# Patient Record
Sex: Female | Born: 1935 | Race: White | Hispanic: No | Smoking: Never smoker
Health system: Southern US, Community
[De-identification: ages and names within clinical notes are randomized; demographics above are authoritative.]

## PROBLEM LIST (undated history)

## (undated) DIAGNOSIS — R279 Unspecified lack of coordination: Secondary | ICD-10-CM

## (undated) DIAGNOSIS — J449 Chronic obstructive pulmonary disease, unspecified: Secondary | ICD-10-CM

## (undated) DIAGNOSIS — G3183 Dementia with Lewy bodies: Secondary | ICD-10-CM

## (undated) DIAGNOSIS — R4701 Aphasia: Secondary | ICD-10-CM

## (undated) DIAGNOSIS — F0280 Dementia in other diseases classified elsewhere without behavioral disturbance: Secondary | ICD-10-CM

## (undated) HISTORY — PX: CHOLECYSTECTOMY: SHX55

---

## 2004-06-03 ENCOUNTER — Other Ambulatory Visit: Payer: Self-pay

## 2005-11-16 ENCOUNTER — Other Ambulatory Visit: Payer: Self-pay

## 2005-11-16 ENCOUNTER — Emergency Department: Payer: Self-pay | Admitting: Internal Medicine

## 2006-06-24 ENCOUNTER — Emergency Department: Payer: Self-pay | Admitting: Emergency Medicine

## 2012-04-22 ENCOUNTER — Emergency Department: Payer: Self-pay | Admitting: Emergency Medicine

## 2012-04-22 LAB — LIPASE, BLOOD: Lipase: 104 U/L (ref 73–393)

## 2012-04-22 LAB — COMPREHENSIVE METABOLIC PANEL
Albumin: 3.8 g/dL (ref 3.4–5.0)
Alkaline Phosphatase: 88 U/L (ref 50–136)
Anion Gap: 7 (ref 7–16)
BUN: 13 mg/dL (ref 7–18)
Co2: 27 mmol/L (ref 21–32)
Creatinine: 1.37 mg/dL — ABNORMAL HIGH (ref 0.60–1.30)
EGFR (African American): 42 — ABNORMAL LOW
EGFR (Non-African Amer.): 36 — ABNORMAL LOW
Glucose: 112 mg/dL — ABNORMAL HIGH (ref 65–99)
Osmolality: 288 (ref 275–301)
SGOT(AST): 29 U/L (ref 15–37)
SGPT (ALT): 21 U/L
Sodium: 144 mmol/L (ref 136–145)
Total Protein: 6.7 g/dL (ref 6.4–8.2)

## 2012-04-22 LAB — CBC
HCT: 39.5 % (ref 35.0–47.0)
MCHC: 34.3 g/dL (ref 32.0–36.0)
Platelet: 218 10*3/uL (ref 150–440)
RBC: 4.57 10*6/uL (ref 3.80–5.20)
RDW: 13.4 % (ref 11.5–14.5)
WBC: 7.7 10*3/uL (ref 3.6–11.0)

## 2012-10-09 ENCOUNTER — Emergency Department: Payer: Self-pay

## 2012-10-09 LAB — URINALYSIS, COMPLETE
Glucose,UR: NEGATIVE mg/dL (ref 0–75)
Ketone: NEGATIVE
Ph: 5 (ref 4.5–8.0)
Protein: NEGATIVE
Specific Gravity: 1.015 (ref 1.003–1.030)
WBC UR: 6 /HPF (ref 0–5)

## 2012-10-09 LAB — CBC
HGB: 12.6 g/dL (ref 12.0–16.0)
MCH: 29.1 pg (ref 26.0–34.0)
MCHC: 34 g/dL (ref 32.0–36.0)
MCV: 86 fL (ref 80–100)
Platelet: 203 10*3/uL (ref 150–440)
RBC: 4.35 10*6/uL (ref 3.80–5.20)
RDW: 13.4 % (ref 11.5–14.5)
WBC: 6.9 10*3/uL (ref 3.6–11.0)

## 2012-10-09 LAB — COMPREHENSIVE METABOLIC PANEL
Albumin: 3.6 g/dL (ref 3.4–5.0)
Alkaline Phosphatase: 96 U/L (ref 50–136)
Anion Gap: 3 — ABNORMAL LOW (ref 7–16)
Bilirubin,Total: 0.5 mg/dL (ref 0.2–1.0)
Calcium, Total: 9.1 mg/dL (ref 8.5–10.1)
Co2: 28 mmol/L (ref 21–32)
Creatinine: 1.46 mg/dL — ABNORMAL HIGH (ref 0.60–1.30)
EGFR (African American): 38 — ABNORMAL LOW
EGFR (Non-African Amer.): 33 — ABNORMAL LOW
Glucose: 92 mg/dL (ref 65–99)
Potassium: 4.6 mmol/L (ref 3.5–5.1)
SGOT(AST): 36 U/L (ref 15–37)
Sodium: 144 mmol/L (ref 136–145)
Total Protein: 6.7 g/dL (ref 6.4–8.2)

## 2012-10-09 LAB — LIPASE, BLOOD: Lipase: 104 U/L (ref 73–393)

## 2012-10-13 ENCOUNTER — Emergency Department: Payer: Self-pay | Admitting: Emergency Medicine

## 2012-10-13 LAB — URINALYSIS, COMPLETE
Bacteria: NONE SEEN
Bilirubin,UR: NEGATIVE
Blood: NEGATIVE
Glucose,UR: NEGATIVE mg/dL (ref 0–75)
Ketone: NEGATIVE
RBC,UR: 1 /HPF (ref 0–5)
Specific Gravity: 1.005 (ref 1.003–1.030)
WBC UR: 1 /HPF (ref 0–5)

## 2012-10-13 LAB — COMPREHENSIVE METABOLIC PANEL
Albumin: 3.6 g/dL (ref 3.4–5.0)
Alkaline Phosphatase: 96 U/L (ref 50–136)
Anion Gap: 8 (ref 7–16)
BUN: 16 mg/dL (ref 7–18)
Bilirubin,Total: 0.7 mg/dL (ref 0.2–1.0)
Chloride: 109 mmol/L — ABNORMAL HIGH (ref 98–107)
Creatinine: 1.63 mg/dL — ABNORMAL HIGH (ref 0.60–1.30)
EGFR (African American): 34 — ABNORMAL LOW
Glucose: 109 mg/dL — ABNORMAL HIGH (ref 65–99)
Osmolality: 283 (ref 275–301)
Potassium: 4.4 mmol/L (ref 3.5–5.1)
SGOT(AST): 33 U/L (ref 15–37)
Total Protein: 6.9 g/dL (ref 6.4–8.2)

## 2012-10-13 LAB — CBC
HCT: 36.1 % (ref 35.0–47.0)
MCH: 29.7 pg (ref 26.0–34.0)
MCV: 86 fL (ref 80–100)
Platelet: 185 10*3/uL (ref 150–440)
RDW: 13.3 % (ref 11.5–14.5)
WBC: 7.3 10*3/uL (ref 3.6–11.0)

## 2012-10-13 LAB — LIPASE, BLOOD: Lipase: 173 U/L (ref 73–393)

## 2013-01-14 ENCOUNTER — Emergency Department: Payer: Self-pay | Admitting: Emergency Medicine

## 2013-01-14 LAB — URINALYSIS, COMPLETE
Bilirubin,UR: NEGATIVE
Glucose,UR: NEGATIVE mg/dL (ref 0–75)
Ketone: NEGATIVE
Protein: NEGATIVE
Specific Gravity: 1.005 (ref 1.003–1.030)
WBC UR: 10 /HPF (ref 0–5)

## 2013-01-14 LAB — COMPREHENSIVE METABOLIC PANEL
Albumin: 3.7 g/dL (ref 3.4–5.0)
Alkaline Phosphatase: 84 U/L (ref 50–136)
BUN: 14 mg/dL (ref 7–18)
Bilirubin,Total: 0.8 mg/dL (ref 0.2–1.0)
Creatinine: 1.16 mg/dL (ref 0.60–1.30)
EGFR (African American): 51 — ABNORMAL LOW
EGFR (Non-African Amer.): 44 — ABNORMAL LOW
Glucose: 117 mg/dL — ABNORMAL HIGH (ref 65–99)
Osmolality: 277 (ref 275–301)
Potassium: 4.4 mmol/L (ref 3.5–5.1)

## 2013-01-14 LAB — CBC
HCT: 40.7 % (ref 35.0–47.0)
HGB: 13.7 g/dL (ref 12.0–16.0)
MCH: 28.5 pg (ref 26.0–34.0)
MCHC: 33.7 g/dL (ref 32.0–36.0)
MCV: 84 fL (ref 80–100)
Platelet: 261 10*3/uL (ref 150–440)
RBC: 4.82 10*6/uL (ref 3.80–5.20)
RDW: 14.1 % (ref 11.5–14.5)
WBC: 9.3 10*3/uL (ref 3.6–11.0)

## 2013-01-27 ENCOUNTER — Emergency Department: Payer: Self-pay | Admitting: Emergency Medicine

## 2013-01-27 LAB — URINALYSIS, COMPLETE
Bacteria: NONE SEEN
Glucose,UR: NEGATIVE mg/dL (ref 0–75)
Nitrite: NEGATIVE
RBC,UR: 1 /HPF (ref 0–5)
Squamous Epithelial: 4

## 2013-01-27 LAB — COMPREHENSIVE METABOLIC PANEL
Albumin: 3.7 g/dL (ref 3.4–5.0)
Alkaline Phosphatase: 75 U/L (ref 50–136)
Anion Gap: 7 (ref 7–16)
BUN: 14 mg/dL (ref 7–18)
Calcium, Total: 9.3 mg/dL (ref 8.5–10.1)
Chloride: 107 mmol/L (ref 98–107)
Creatinine: 1.41 mg/dL — ABNORMAL HIGH (ref 0.60–1.30)
EGFR (Non-African Amer.): 35 — ABNORMAL LOW
Glucose: 109 mg/dL — ABNORMAL HIGH (ref 65–99)

## 2013-01-27 LAB — CBC
HCT: 40.9 % (ref 35.0–47.0)
HGB: 13.6 g/dL (ref 12.0–16.0)
MCH: 28.1 pg (ref 26.0–34.0)
WBC: 8.9 10*3/uL (ref 3.6–11.0)

## 2013-01-27 LAB — LIPASE, BLOOD: Lipase: 150 U/L (ref 73–393)

## 2013-05-26 ENCOUNTER — Ambulatory Visit: Payer: Self-pay | Admitting: Orthopaedic Surgery

## 2013-05-26 ENCOUNTER — Inpatient Hospital Stay: Payer: Self-pay | Admitting: Internal Medicine

## 2013-05-26 LAB — COMPREHENSIVE METABOLIC PANEL
Albumin: 3.2 g/dL — ABNORMAL LOW (ref 3.4–5.0)
Alkaline Phosphatase: 84 U/L (ref 50–136)
Anion Gap: 6 — ABNORMAL LOW (ref 7–16)
BUN: 16 mg/dL (ref 7–18)
Bilirubin,Total: 0.8 mg/dL (ref 0.2–1.0)
Calcium, Total: 8.7 mg/dL (ref 8.5–10.1)
Osmolality: 280 (ref 275–301)
SGOT(AST): 23 U/L (ref 15–37)
SGPT (ALT): 22 U/L (ref 12–78)

## 2013-05-26 LAB — CBC
HGB: 13 g/dL (ref 12.0–16.0)
MCH: 29 pg (ref 26.0–34.0)
MCV: 88 fL (ref 80–100)
RBC: 4.48 10*6/uL (ref 3.80–5.20)
WBC: 6.9 10*3/uL (ref 3.6–11.0)

## 2013-05-26 LAB — URINALYSIS, COMPLETE
Bacteria: NONE SEEN
Glucose,UR: NEGATIVE mg/dL (ref 0–75)
Ketone: NEGATIVE
Leukocyte Esterase: NEGATIVE
Nitrite: NEGATIVE
Protein: NEGATIVE
RBC,UR: 2 /HPF (ref 0–5)
Specific Gravity: 1.017 (ref 1.003–1.030)
Squamous Epithelial: NONE SEEN
WBC UR: 2 /HPF (ref 0–5)

## 2013-05-27 LAB — BASIC METABOLIC PANEL
Anion Gap: 2 — ABNORMAL LOW (ref 7–16)
BUN: 21 mg/dL — ABNORMAL HIGH (ref 7–18)
Calcium, Total: 8.3 mg/dL — ABNORMAL LOW (ref 8.5–10.1)
Chloride: 104 mmol/L (ref 98–107)
Creatinine: 1.28 mg/dL (ref 0.60–1.30)
EGFR (African American): 45 — ABNORMAL LOW
EGFR (Non-African Amer.): 39 — ABNORMAL LOW
Glucose: 104 mg/dL — ABNORMAL HIGH (ref 65–99)
Sodium: 137 mmol/L (ref 136–145)

## 2013-05-27 LAB — CBC WITH DIFFERENTIAL/PLATELET
Basophil #: 0.1 10*3/uL (ref 0.0–0.1)
Basophil %: 0.5 %
Eosinophil #: 0 10*3/uL (ref 0.0–0.7)
HGB: 11.4 g/dL — ABNORMAL LOW (ref 12.0–16.0)
Lymphocyte #: 1.2 10*3/uL (ref 1.0–3.6)
Lymphocyte %: 9.8 %
MCH: 29.2 pg (ref 26.0–34.0)
MCHC: 33.6 g/dL (ref 32.0–36.0)
Monocyte #: 0.9 x10 3/mm (ref 0.2–0.9)
Neutrophil #: 10 10*3/uL — ABNORMAL HIGH (ref 1.4–6.5)
Platelet: 199 10*3/uL (ref 150–440)
RBC: 3.91 10*6/uL (ref 3.80–5.20)
RDW: 13.1 % (ref 11.5–14.5)
WBC: 12.2 10*3/uL — ABNORMAL HIGH (ref 3.6–11.0)

## 2013-05-27 LAB — TSH: Thyroid Stimulating Horm: 0.598 u[IU]/mL

## 2013-05-27 LAB — PROTIME-INR: INR: 1.1

## 2013-05-28 LAB — CBC WITH DIFFERENTIAL/PLATELET
Basophil #: 0.1 10*3/uL (ref 0.0–0.1)
Basophil %: 0.9 %
HGB: 9 g/dL — ABNORMAL LOW (ref 12.0–16.0)
Lymphocyte #: 1.2 10*3/uL (ref 1.0–3.6)
Lymphocyte %: 13.9 %
MCHC: 34.4 g/dL (ref 32.0–36.0)
MCV: 86 fL (ref 80–100)
Monocyte #: 0.9 x10 3/mm (ref 0.2–0.9)
Monocyte %: 10.7 %
Neutrophil #: 6.2 10*3/uL (ref 1.4–6.5)
Neutrophil %: 74.2 %
Platelet: 132 10*3/uL — ABNORMAL LOW (ref 150–440)
RBC: 3.03 10*6/uL — ABNORMAL LOW (ref 3.80–5.20)
RDW: 13 % (ref 11.5–14.5)
WBC: 8.4 10*3/uL (ref 3.6–11.0)

## 2013-05-28 LAB — BASIC METABOLIC PANEL
Calcium, Total: 8.4 mg/dL — ABNORMAL LOW (ref 8.5–10.1)
Co2: 27 mmol/L (ref 21–32)
EGFR (African American): 56 — ABNORMAL LOW

## 2013-05-29 LAB — BASIC METABOLIC PANEL
Anion Gap: 4 — ABNORMAL LOW (ref 7–16)
BUN: 20 mg/dL — ABNORMAL HIGH (ref 7–18)
Calcium, Total: 8.1 mg/dL — ABNORMAL LOW (ref 8.5–10.1)
Chloride: 106 mmol/L (ref 98–107)
Co2: 26 mmol/L (ref 21–32)
Creatinine: 1.29 mg/dL (ref 0.60–1.30)
Osmolality: 275 (ref 275–301)
Potassium: 4 mmol/L (ref 3.5–5.1)
Sodium: 136 mmol/L (ref 136–145)

## 2013-05-29 LAB — HEMOGLOBIN: HGB: 8.2 g/dL — ABNORMAL LOW (ref 12.0–16.0)

## 2014-07-19 IMAGING — XA DG C-ARM 1-60 MIN
1 series · 4 of 4 positions shown · non-contrast
Comparison: none

REASON FOR EXAM: left hip fracture
COMMENTS:

PROCEDURE:     DXR - DXR C-ARM WITH SPOT IMAGES  - May 27, 2013  [DATE]
RESULT:     Patient status post reduction internal fixation left hip
fracture. Good anatomic alignment noted.

[Series 6001: (person_name) · 4 of 4 slices shown]
[im 1/4]
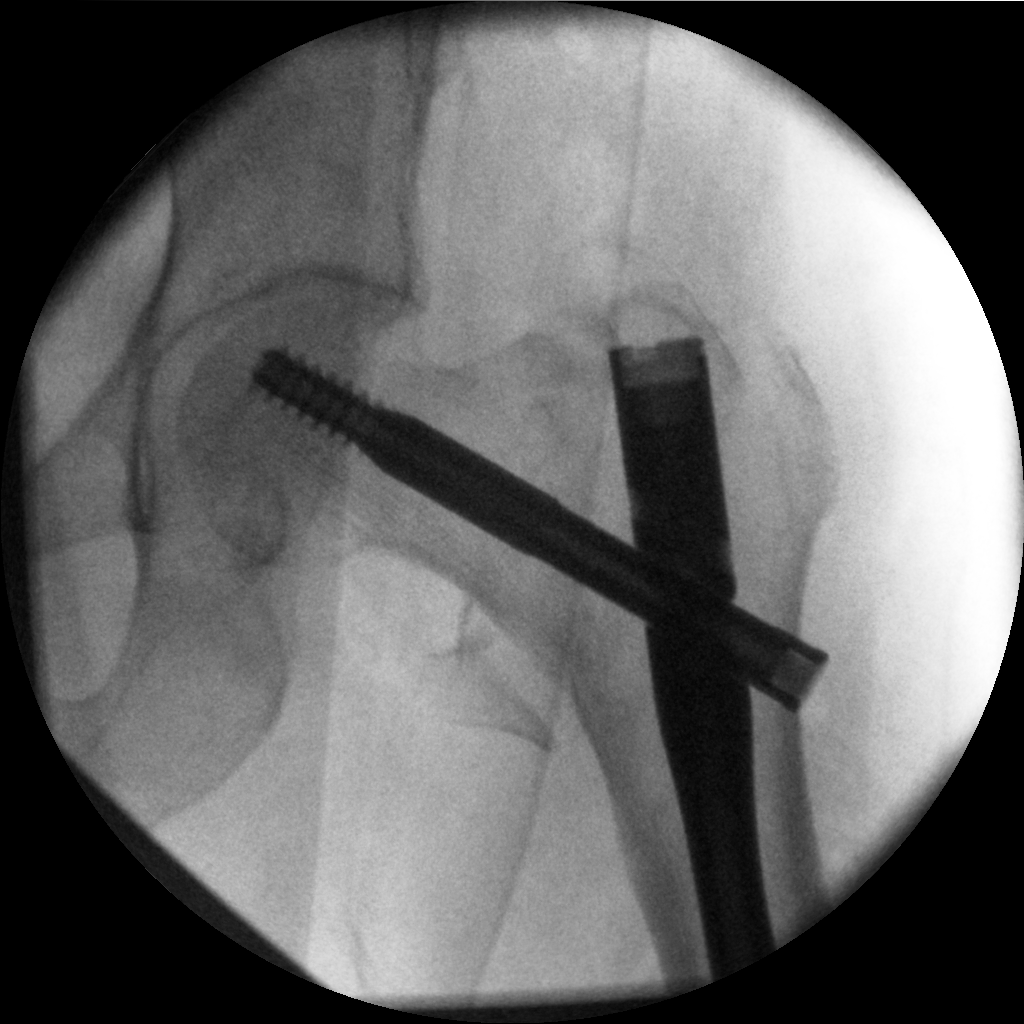
[im 2/4]
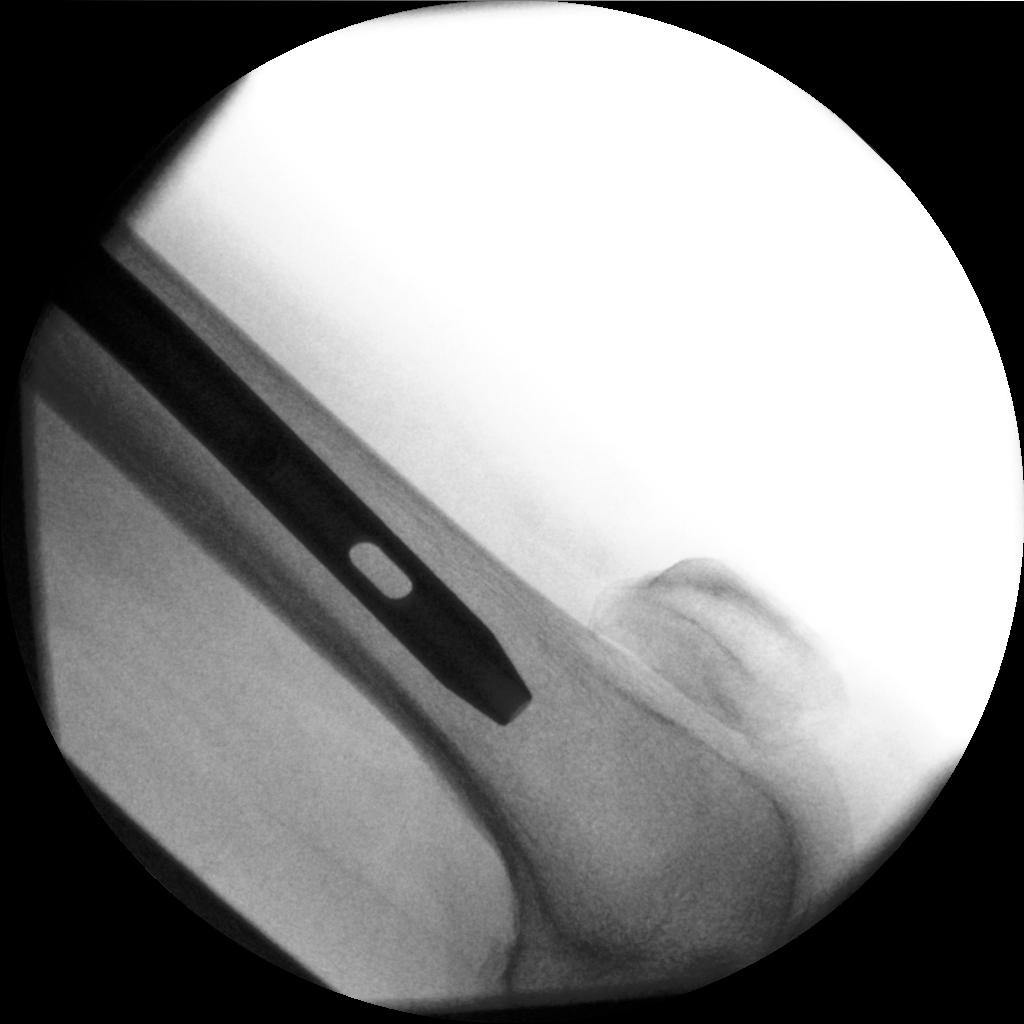
[im 3/4]
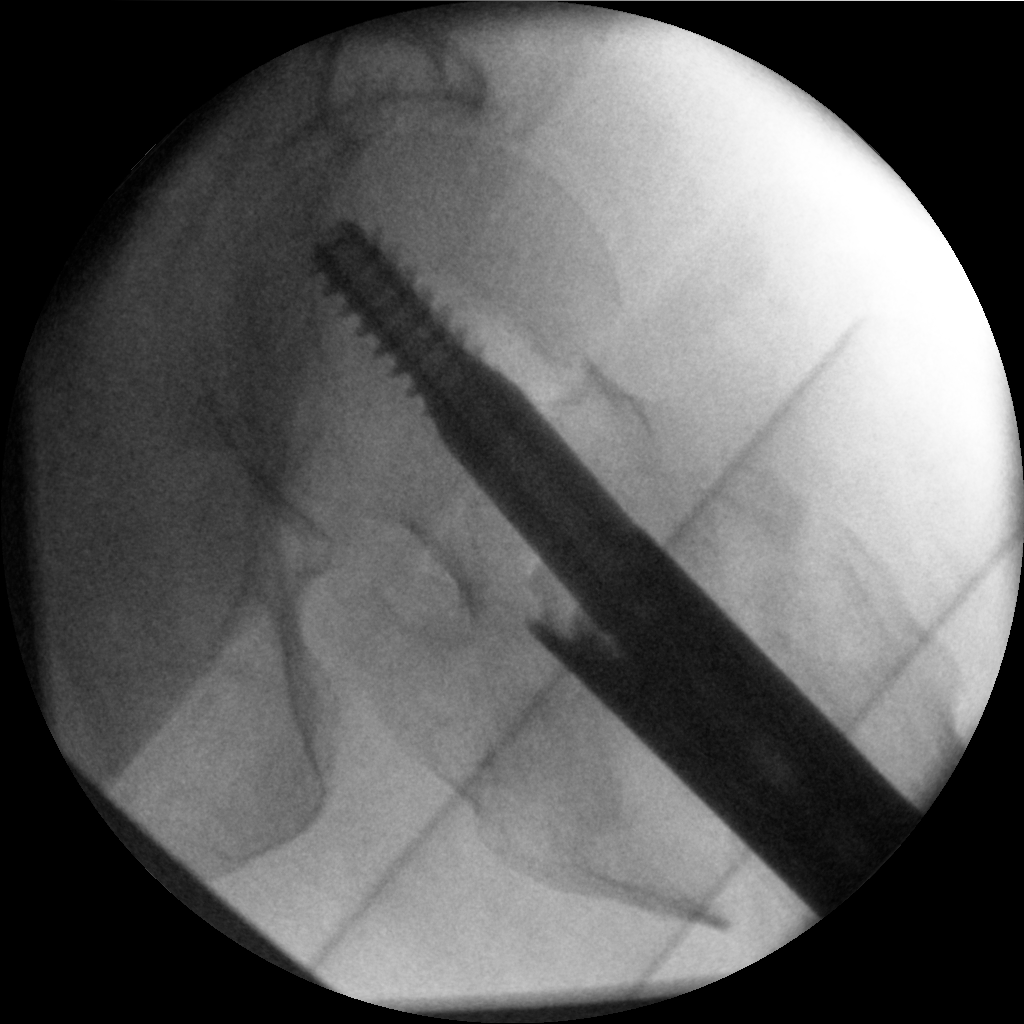
[im 4/4]
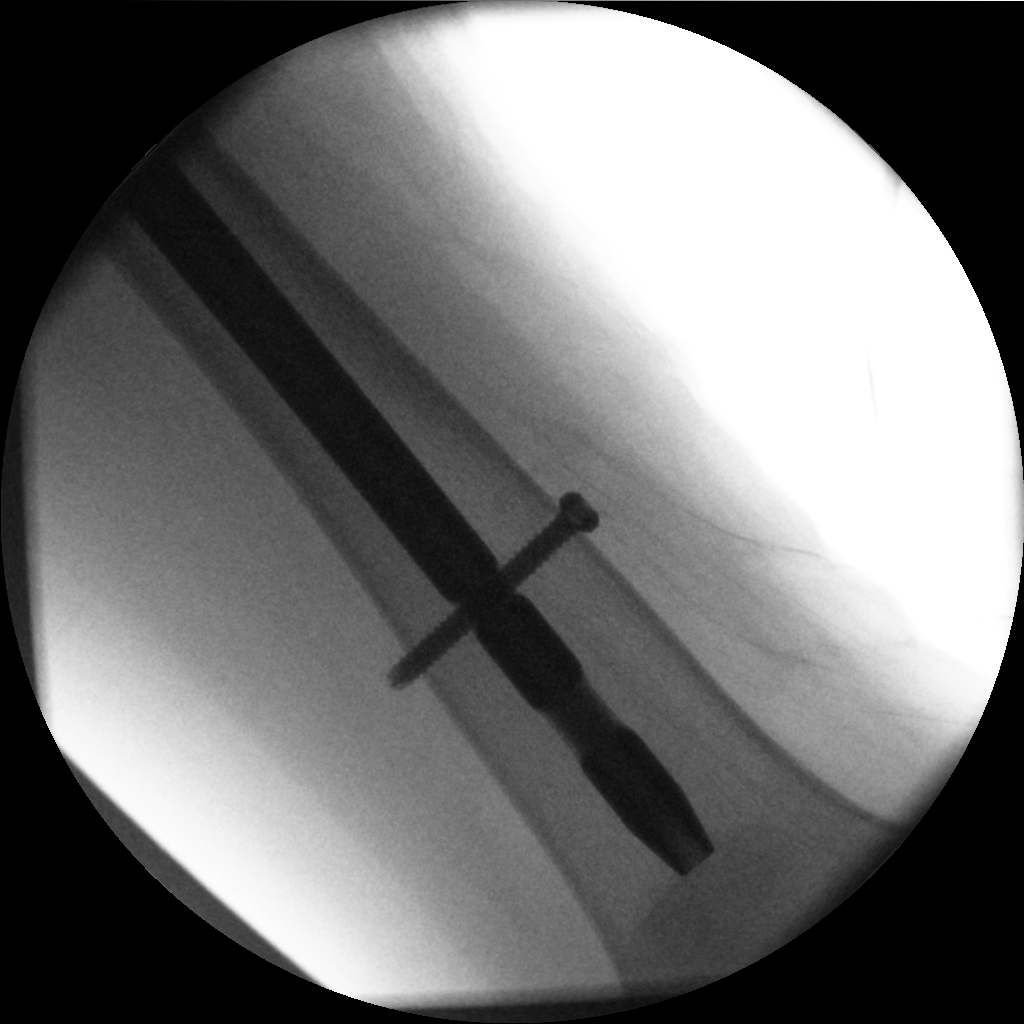

[4 of 4 positions shown; findings below may reference images not displayed]

IMPRESSION: ORIF left hip . Good anatomic alignment.

## 2015-01-15 NOTE — Consult Note (Signed)
PRIMARY CARE PHYSICIAN: Meindert A. Lacie ScottsNiemeyer, MD PHYSICIAN: Delfino LovettShah, Vipul, MD COMPLAINT: Left hip fracture OF PRESENT ILLNESS:  81 year old woman with left hip fracture. [facility], fell off the couch  fell on her left side with injury to hip. No LOC. Pain is worse with motion better with rest, Pt is no oridetated to her injury on my exam; pain is minimal, Pt is upright in bed. She was admitted under the hip fracture protocol  MEDICAL HISTORY: COPD.  Anxiety.  Esophageal reflux.  Abdominal pain.  DJD. Renal colic.  Atherosclerotic heart disease. Dementia. Osteoporosis.  Lumbago.    ALLERGIES: No known drug allergies.  AT HOME:  Tramadol 50 mg 1 tablet twice a day.  Ventolin HFA as needed. 3.  Xanax 0.5mg  1 tablet p.o. b.i.d.  Lipitor 40 mg p.o. daily. Prilosec 40 mg p.o. daily. Paxil 20 mg p.o. daily. Promethazine 25 mg 1 tablet p.o. 4 times a day.  HISTORY: No smoking, tobacco or alcohol. [facility].  HISTORY: Positive for coronary artery disease with mother dying at the age of 79.  OF SYSTEMS:   No fever. Positive for fatigue and weakness. Wears eyeglasses. No glaucoma. Significantly decreased hearing. No cough, wheezing, hemoptysis. No chest pain, orthopnea, edema. No nausea, vomiting, diarrhea. No dysuria or hematuria. No polyuria or nocturia.No anemia or easy bruising. No rash or lesion. Positive for left leg pain. Also has a history of lumbar back painNo tingling, numbness, weakness. She does have a history of dementia. History of anxiety and possible depression.  EXAMINATION:SIGNS:AFVSSAlert, but not orientated to her injury or time non labored respirations regular rate Full ROM of b/l UE; strength 5/5 SILT C5-T1; SILT L3-S1; + log roll on left; neg on right; +ehl, fhl, adf, apf. +1 DP pulseNo obvious rash, lesion, ulcer.  AND DIAGNOSTIC DATA: CBC WNL. Negative urinalysis. BMP WNLs, except creatinine of 1.46.  Left hip shows  intertrocanteric hip fractureAND PLAN: Left IT hip fracture; Medicine has cleared the Pt.  Plan for ORIF with  Dr. Claris Gladdenamasunder for possible surgery tomorrow.   Medicine has cleared Pt for surgery  3.  TED hose for deep venous thrombosis prophylaxis  4.  Pain control per primary team  Electronic Signatures: Weyman PedroGallizzi, Zinia Innocent A (MD)  (Signed on 01-Sep-14 22:08)  Authored  Last Updated: 01-Sep-14 22:08 by Weyman PedroGallizzi, Bailynn Dyk A (MD)

## 2015-01-15 NOTE — Discharge Summary (Signed)
PATIENT NAME:  Shirley Villanueva, Shirley Villanueva MR#:  [id number] DATE OF BIRTH:  09/23/1928  DATE OF ADMISSION:  05/26/2013 DATE OF DISCHARGE:  05/30/2013   ADMITTING DIAGNOSES:  1.  Status post fall with left hip fracture, status post intramedullary nail fixation of the left intertrochanteric hip fracture.  2.  Chronic, chronic obstructive pulmonary disease without any evidence of exacerbation. 3.  Anxiety, depression with acute delirium. 4.  Hyperlipidemia. 5.  Acute blood loss anemia. 6.  Gastroesophageal reflux disease. 7.  History of abdominal pain. 8.  Degenerative joint disease. 9.  Renal colic. 10.  Atherosclerotic heart disease. 11.  Dementia. 12.  Osteoporosis. 13.  Lumbago.  CONSULTANTS: Dr. Claris Gladdenamasunder.  PERTINENT LABORATORIES AND EVALUATIONS: Admitting glucose 163, BUN 16, creatinine 1.46, sodium 138, potassium 3.8, chloride 105; CO2 was 26. LFTs showed a total protein of 6.0, albumin of 3.2. WBC 6.9, hemoglobin 13; platelet count was 186. Most recent hemoglobin was 8.2 on September 4. INR 1.1. Urinalysis was negative.   HOSPITAL COURSE: Please refer to H and P done by the admitting physician. The patient is an 81 year old female with known history of anxiety, COPD, GERD, who fell and sustained a left hip fracture. We were asked to admit the patient for hip fracture. She was admitted under the hospitalist service with orthopedic consult. She underwent a left hip intramedullary nail fixation and tolerated the procedure well. The patient has been confused postop and has developed some delirium. Overall, she is otherwise stable and is doing okay. She needs further aggressive rehab and treatment. Arrangements for rehab are made.  DISCHARGE MEDICATIONS: Omeprazole 40 daily, Xanax 0.5 one tab p.o. b.i.d. as needed, ProAir 1 puff q.4 p.r.n., promethazine 25 q.4 p.r.n., Paxil 20 daily, atorvastatin 40 at bedtime, albuterol 4 inhalations 4 times a day, Tylenol 650 q.4 p.r.n., acetaminophen/hydrocodone 325/5  one tab p.o. q.6 p.r.n. for pain, risperidone 0.5 b.i.d., iron sulfate 325 one tab b.i.d., docusate 100 one tab p.o. b.i.d. as needed, calcium with vitamin D 1 tab p.o. b.i.d.   ACTIVITY: As tolerated. She needs further physical therapy and rehab.  DIET: Low-sodium.  FOLLOWUP: With [facility] in 2 to 4 weeks. Primary MD in 2 to 4 weeks.   Please refer to the orthopedic discharge instructions which have been done for further dressing changes and activity and other orders.   NOTE: Thirty-five minutes spent.  ____________________________ Lacie ScottsShreyang H. Allena KatzPatel, MD shp:jm D: 05/30/2013 12:29:47 ET T: 05/30/2013 12:44:55 ET JOB#: [id number]  cc: Shirley Wymore H. Allena KatzPatel, MD, <Dictator> Shirley CarwinSHREYANG H Terilyn Sano MD ELECTRONICALLY SIGNED 06/07/2013 14:15

## 2015-01-15 NOTE — Op Note (Signed)
PATIENT NAME:  THAYER, Shirley MR#:  [id number] DATE OF BIRTH:  [birth year]  DATE OF PROCEDURE:  05/27/2013  PREOPERATIVE DIAGNOSIS: Intertrochanteric fracture, left hip.   POSTOPERATIVE DIAGNOSIS: Intertrochanteric fracture, left hip.   PROCEDURE PERFORMED: Intramedullary nail fixation, left intertrochanteric hip fracture.   SURGEON: Murlean Hark M.D.   ASSISTANT: None.   ANESTHESIA: Spinal anesthesia.   ESTIMATED BLOOD LOSS: 100 mL.   SURGICAL FINDINGS: Intertrochanteric fracture, left hip.   IMPLANTS USED: AFFIXUS Biomet hip fracture nail, left side, 130 degrees, 11 x 380 mm; AFFIXUS hip fracture nail lag screw 10.5 x 105 mm; AFFIXUS cortical bone screw 5 x 44 mm.   COMPLICATIONS: No immediate intraoperative or postoperative complications were noted.   DISPOSITION: The patient will be weight-bearing as tolerated. She will be placed on Lovenox for DVT prophylaxis. She will be followed in the hospital postoperatively.   INDICATIONS FOR PROCEDURE: Ms. Schrom is an 81 year old female who lives in a group home. She had fallen the day prior to surgery, sustaining the aforementioned fracture. She was admitted to the medical service. Orthopedic consultation was done by Dr. Danton Sewer. Orthopedic care was transferred to me the morning of surgery.   Risks and benefits were explained to the patient as well as the power of attorney who ultimately gave consent for the patient's surgery. The family was unable to be located, and per the group home in which she resides, medical decisions are made by her power of attorney. Her sister was contacted and surgery was discussed with her sister preoperatively and postoperatively.   DESCRIPTION OF PROCEDURE: Ms. Jacques was brought in the operating room. She was placed on the table in supine position. The patient was rolled laterally and spinal anesthesia was administered. She was placed now on the fracture table. Torso was secured with a safety belt.  A  post was placed between the legs. The right lower extremity placed in a well-leg holder. Left lower extremity was placed in traction set-up. Appropriate padding was placed throughout. The fracture was reduced and orthogonal fluoroscopic radiographs were obtained to confirm reduction.   Left lower extremity was prepared and draped in the usual sterile fashion.   Surgical timeout was performed, identifying the patient, procedure, laterality, imaging studies. Confirming antibiotics were given, confirming allergies, and reviewing consent form.   Under fluoroscopic guidance, the tip of the greater trochanter was marked. Just above the tip of the greater trochanter, a longitudinal incision was made and sharp dissection was carried down through the fascia. Blunt dissection was carried down through the muscle to the tip of the greater trochanter. A starting pin was inserted just medial to the tip of the greater trochanter and inserted to the level of the lesser trochanter. Placement was confirmed on orthogonal radiographs. The  soft-tissue protector was inserted and opening reamer was placed to drill through the proximal cortex at the level of the lesser trochanter. At this time, a ball-tipped guidewire was inserted to the metaphyseal flare. Ball-tipped guidewire was measured. Sequential reaming was carried out to 13 mm.   A 11  x 380 mm nail was now inserted. Placement of the nail was confirmed on orthogonal radiographs. When the hip screw hole was found to be in adequate position, the lag screw drill sleeve was inserted through the provided guide. A second incision was made and sharp dissection was carried down through the fascia. Blunt dissection was carried down to the bone. Drill guide was inserted down to bone and locked. Using the provided  goal post guide, pin was inserted and placement was confirmed on orthogonal radiographs, insuring that placement of the pin was centered in the femoral neck. This was  measured and overdrilled to 105 mm. A 105 x 10.5 mm lag screw was placed. When the screw was maxed out, all traction was released from the leg and some mild compression was done.   The insertion guide was removed. Final orthogonal radiographs of the hip were obtained.   Attention was now turned to the distal femur. Using a standard perfect circle technique, a static interlocking screw was placed.   All incisions were copiously irrigated. The fascial layer was closed using 0 Vicryl suture. Subcutaneous tissue was closed using 2-0 Vicryl suture. Skin was closed using staples. Sterile dressings were applied. The patient's leg was removed from traction and contralateral leg was removed from well-leg holder and placed securely back on the table. The patient was awoken and strapped to her hospital bed. She was returned to the postoperative care unit in stable condition.   ____________________________ Murlean HarkShalini Ellene Bloodsaw, MD sr:np D: 05/28/2013 16:22:56 ET T: 05/28/2013 20:22:58 ET JOB#: [id number]  cc: Murlean HarkShalini Cephas Revard, MD, <Dictator> Murlean HarkSHALINI Jairon Ripberger MD ELECTRONICALLY SIGNED 06/24/2013 15:16

## 2015-01-15 NOTE — H&P (Signed)
PATIENT NAME:  Shirley Villanueva, Shirley V MR#:  [id number] DATE OF BIRTH:  09/23/1928  DATE OF ADMISSION:  05/26/2013  PRIMARY CARE PHYSICIAN: Meindert A. Lacie ScottsNiemeyer, MD  REQUESTING PHYSICIAN: Randon Goldsmithebecca L. Shaune PollackLord, MD  CHIEF COMPLAINT: Fall and left hip pain.   HISTORY OF PRESENT ILLNESS: The patient is an 81 year old female with a known history of anxiety, COPD, GERD, is being admitted for left hip fracture. [facility], which she was trying to get off the couch and tripped over the shoes and fell on her left side with injury to hip. She denies hitting her head or loss of consciousness. While in the ED, she was having significant pain in the left hip and x-ray shows proximal left femur fracture in the intertrochanteric region.   She is being admitted for further evaluation and management. ER physician has talked with orthopedic physician on call, Dr. Jeannene PatellaGallizzi, who is planning to do surgery tomorrow, and Dr. Jeannene PatellaGallizzi will discuss with Dr. Claris Gladdenamasunder for surgery tomorrow.   PAST MEDICAL HISTORY: 1.  COPD.  2.  Anxiety.  3.  Esophageal reflux.  4.  Abdominal pain.  5.  DJD. 6.  Renal colic.  7.  Atherosclerotic heart disease. 8.  Dementia. 9.  Osteoporosis.  10.  Lumbago.    ALLERGIES: No known drug allergies.   MEDICATIONS AT HOME:  1.  Tramadol 50 mg 1 tablet twice a day.  2.  Ventolin HFA as needed. 3.  Xanax 0.5 mg 1 tablet p.o. b.i.d.  4.  Lipitor 40 mg p.o. daily. 5.  Prilosec 40 mg p.o. daily. 6.  Paxil 20 mg p.o. daily. 7.  Promethazine 25 mg 1 tablet p.o. 4 times a day.   SOCIAL HISTORY: No smoking, tobacco or alcohol. [facility].   FAMILY HISTORY: Positive for coronary artery disease with mother dying at the age of 79.   REVIEW OF SYSTEMS:    CONSTITUTIONAL: No fever. Positive for fatigue and weakness. Also has significant pain in her left hip. EYES: Wears eyeglasses. No glaucoma.  ENT: Significantly  decreased hearing.  RESPIRATORY: No cough, wheezing, hemoptysis.  CARDIOVASCULAR: No chest pain, orthopnea, edema.  GASTROINTESTINAL: No nausea, vomiting, diarrhea.  GENITOURINARY: No dysuria or hematuria.  ENDOCRINE: No polyuria or nocturia. HEMATOLOGIC: No anemia or easy bruising.  SKIN: No rash or lesion.  MUSCULOSKELETAL: Positive for left leg pain. Also has a history of lumbago.  NEUROLOGIC: No tingling, numbness, weakness. She does have a history of dementia.  PSYCHIATRIC: History of anxiety and possible depression.   PHYSICAL EXAMINATION: VITAL SIGNS: Temperature 97.9, heart rate 104 per minute, respirations 18 per minute, blood pressure 150/62 mmHg. She is saturating 94% on room air.  GENERAL: The patient is an 81 year old female lying in the bed in some pain.  EYES: Pupils equal, round, reactive to light and accommodation. No scleral icterus. Extraocular muscles intact.  HENT: Head atraumatic, normocephalic. Oropharynx and nasopharynx clear.  NECK: Supple. No jugular venous distention. No thyroid enlargement or tenderness. LUNGS: Clear to auscultation bilaterally. No wheezing, rales, rhonchi or crepitation.  CARDIOVASCULAR: S1, S2 normal. No murmur, rubs or gallop.  ABDOMEN: Soft, nontender, nondistended. Bowel sounds present. No organomegaly or masses.  EXTREMITIES: No pedal edema, cyanosis or clubbing. She has a significant tenderness around her left upper thigh region and has an externally rotated left leg and foot. She was unable to move much of her left leg without significant pain, and she  is guarding her leg on the left side. I was unable to assess range of motion  or shortening due to significant pain.  NEUROLOGIC: Cranial nerves II through XII intact. Muscle strength 5/5 in all extremities, except left lower extremity which was not assessed due to significant pain due to fracture. Sensation intact. Gait not checked.  PSYCHIATRIC: The patient is alert and oriented x 3,  although she does feel a little drowsy, likely from the medication that she received here in the Emergency Department.  SKIN: No obvious rash, lesion, ulcer.   LABORATORY AND DIAGNOSTIC DATA: CBC within normal limits. Negative urinalysis. Normal liver function tests. BMP within normal limits, except creatinine of 1.46.   EKG shows sinus tachycardia, left axis deviation, low-voltage QRS and right bundle branch block. No major ST-T changes. Her heart rate was 115 per minute while in the ED.   IMPRESSION AND PLAN: 1.  Left-sided hip pain, likely from left intertrochanteric fracture: Will consult orthopedics. The case was discussed by Emergency Room physician with Dr. Jeannene Patella, who is on call, who will discuss the case with Dr. Claris Gladden for possible surgery tomorrow. Will provide Norco and morphine for pain control as needed.  2.  Preoperative medical clearance: The patient is medically cleared for planned surgery. She will be at moderate risk for planned surgery considering her advanced age, chronic obstructive pulmonary disease and anxiety. Her biggest postoperative complication could be risk of anemia, respiratory failure and mental status changes. These were explained to the patient, although she is not too clear understanding this due to her significant difficulty hearing. 3.  Chronic obstructive pulmonary disease: Stable at this time. Will start her on DuoNeb at this time.  4.  Acute renal failure: Likely prerenal in nature. Will hydrate her with IV fluids and monitor her renal function. Avoid any nephrotoxins.  5.  Anxiety, possible depression: Will continue Paxil and Xanax as needed.  6.  Hyperlipidemia: Will continue statin.  7.  Her echocardiogram in 2002 showed ejection fraction of 66%.  8.  Will add Protonix for stress ulcer prophylaxis and sequential compression devices and TED hose for deep venous thrombosis prophylaxis.   TOTAL TIME TAKING CARE OF THIS PATIENT: 55 minutes.   CODE  STATUS: Full code.    ____________________________ Ellamae Sia. Sherryll Burger, MD vss:jm D: 05/26/2013 16:35:01 ET T: 05/26/2013 17:32:36 ET JOB#: [id number]  cc: Nera Haworth S. Sherryll Burger, MD, <Dictator> Meindert A. Lacie Scotts, MD Ellamae Sia [facility] MD ELECTRONICALLY SIGNED 05/28/2013 17:17

## 2018-07-20 ENCOUNTER — Emergency Department: Payer: Medicare Other

## 2018-07-20 ENCOUNTER — Other Ambulatory Visit: Payer: Self-pay

## 2018-07-20 ENCOUNTER — Emergency Department
Admission: EM | Admit: 2018-07-20 | Discharge: 2018-07-20 | Disposition: A | Discharge status: Home / Self Care | Payer: Medicare Other | Attending: Emergency Medicine | Admitting: Emergency Medicine

## 2018-07-20 DIAGNOSIS — Y939 Activity, unspecified: Secondary | ICD-10-CM | POA: Insufficient documentation

## 2018-07-20 DIAGNOSIS — Y92129 Unspecified place in nursing home as the place of occurrence of the external cause: Secondary | ICD-10-CM | POA: Diagnosis not present

## 2018-07-20 DIAGNOSIS — F0390 Unspecified dementia without behavioral disturbance: Secondary | ICD-10-CM | POA: Diagnosis not present

## 2018-07-20 DIAGNOSIS — S0011XA Contusion of right eyelid and periocular area, initial encounter: Secondary | ICD-10-CM | POA: Diagnosis not present

## 2018-07-20 DIAGNOSIS — S0990XA Unspecified injury of head, initial encounter: Secondary | ICD-10-CM | POA: Insufficient documentation

## 2018-07-20 DIAGNOSIS — Y999 Unspecified external cause status: Secondary | ICD-10-CM | POA: Insufficient documentation

## 2018-07-20 DIAGNOSIS — W19XXXA Unspecified fall, initial encounter: Secondary | ICD-10-CM | POA: Diagnosis not present

## 2018-07-20 HISTORY — DX: Dementia with Lewy bodies: G31.83

## 2018-07-20 HISTORY — DX: Dementia in other diseases classified elsewhere, unspecified severity, without behavioral disturbance, psychotic disturbance, mood disturbance, and anxiety: F02.80

## 2018-07-20 NOTE — ED Triage Notes (Signed)
Pt arrives from [address] via [place]. Pt hx adv dementia witnessed mechanical fall. Closed hematoma above R eye. Dr. Mayford Knife bedside.

## 2018-07-20 NOTE — ED Provider Notes (Signed)
[facility] Emergency Department Provider Note       Time seen: ----------------------------------------- 5:14 PM on 07/20/2018 ----------------------------------------- Level V caveat: History/ROS limited by dementia  I have reviewed the triage vital signs and the nursing notes.  HISTORY   Chief Complaint No chief complaint on file.   HPI Shirley Villanueva is a 84 y.o. female with a history of dementia who presents to the ED for a mechanical fall that was witnessed.  Patient has advanced dementia and does not remember the event.  She was noted to have a hematoma around her right eye.  She denies any complaints or injuries at this time.  No past medical history on file.  There are no active problems to display for this patient.  Allergies Patient has no allergy information on record.  Social History Social History   Tobacco Use  . Smoking status: Not on file  Substance Use Topics  . Alcohol use: Not on file  . Drug use: Not on file   Review of Systems Positive for fall, head injury, Positive for hematoma around the right eye Review of systems is otherwise unknown  All systems negative/normal/unremarkable except as stated in the HPI  ____________________________________________   PHYSICAL EXAM:  VITAL SIGNS: ED Triage Vitals  Enc Vitals Group     BP      Pulse      Resp      Temp      Temp src      SpO2      Weight      Height      Head Circumference      Peak Flow      Pain Score      Pain Loc      Pain Edu?      Excl. in GC?    Constitutional: Alert and disoriented. Well appearing and in no distress. Eyes: Conjunctivae are normal. Normal extraocular movements. ENT   Head: Normocephalic, right periorbital ecchymosis   Nose: No congestion/rhinnorhea.   Mouth/Throat: Mucous membranes are moist.   Neck: No stridor. Cardiovascular: Normal rate, regular rhythm. No murmurs, rubs, or gallops. Respiratory: Normal  respiratory effort without tachypnea nor retractions. Breath sounds are clear and equal bilaterally. No wheezes/rales/rhonchi. Gastrointestinal: Soft and nontender. Normal bowel sounds Musculoskeletal: Nontender with normal range of motion in extremities. No lower extremity tenderness nor edema. Neurologic:  Normal speech and language. No gross focal neurologic deficits are appreciated.  Skin: Right periorbital ecchymosis and hematoma Psychiatric: Mood and affect are normal. Speech and behavior are normal.  ____________________________________________  ED COURSE:  As part of my medical decision making, I reviewed the following data within the electronic MEDICAL RECORD NUMBER History obtained from family if available, nursing notes, old chart and ekg, as well as notes from prior ED visits. Patient presented for a fall that was witnessed with head injury, we will assess with  imaging as indicated at this time.   Procedures ____________________________________________   RADIOLOGY  CT head IMPRESSION: 1. No acute intracranial abnormalities. 2. Age-appropriate volume loss. Mild chronic microvascular ischemic change. 3. Small right lateral periorbital hematoma. ____________________________________________  DIFFERENTIAL DIAGNOSIS   Contusion, fracture, subdural, subarachnoid  FINAL ASSESSMENT AND PLAN  Fall, minor head injury, hematoma   Plan: The patient had presented for a witnessed fall. Patient's imaging is unremarkable.  She appears at her baseline, can ambulate without any difficulty.  She is cleared for outpatient follow-up.   Ulice Dash, MD   Note:  This note was generated in part or whole with voice recognition software. Voice recognition is usually quite accurate but there are transcription errors that can and very often do occur. I apologize for any typographical errors that were not detected and corrected.     Emily Filbert, MD 07/20/18 Barry Brunner

## 2018-07-20 NOTE — ED Notes (Signed)
Waiting to be transported by [facility] to [name].)

## 2019-08-31 ENCOUNTER — Other Ambulatory Visit: Payer: Self-pay

## 2019-08-31 ENCOUNTER — Encounter: Payer: Self-pay | Admitting: Emergency Medicine

## 2019-08-31 ENCOUNTER — Observation Stay
Admission: EM | Admit: 2019-08-31 | Discharge: 2019-09-01 | Disposition: A | Discharge status: Skilled Nursing Facility | Payer: Medicare Other | Attending: Internal Medicine | Admitting: Internal Medicine

## 2019-08-31 ENCOUNTER — Observation Stay: Payer: Medicare Other

## 2019-08-31 ENCOUNTER — Emergency Department: Payer: Medicare Other

## 2019-08-31 DIAGNOSIS — I480 Paroxysmal atrial fibrillation: Secondary | ICD-10-CM | POA: Diagnosis not present

## 2019-08-31 DIAGNOSIS — J449 Chronic obstructive pulmonary disease, unspecified: Secondary | ICD-10-CM | POA: Insufficient documentation

## 2019-08-31 DIAGNOSIS — R7989 Other specified abnormal findings of blood chemistry: Secondary | ICD-10-CM | POA: Diagnosis not present

## 2019-08-31 DIAGNOSIS — Z79899 Other long term (current) drug therapy: Secondary | ICD-10-CM | POA: Diagnosis not present

## 2019-08-31 DIAGNOSIS — T699XXA Effect of reduced temperature, unspecified, initial encounter: Secondary | ICD-10-CM | POA: Diagnosis present

## 2019-08-31 DIAGNOSIS — E785 Hyperlipidemia, unspecified: Secondary | ICD-10-CM | POA: Diagnosis not present

## 2019-08-31 DIAGNOSIS — F418 Other specified anxiety disorders: Secondary | ICD-10-CM | POA: Insufficient documentation

## 2019-08-31 DIAGNOSIS — T68XXXA Hypothermia, initial encounter: Secondary | ICD-10-CM | POA: Diagnosis not present

## 2019-08-31 DIAGNOSIS — N390 Urinary tract infection, site not specified: Secondary | ICD-10-CM | POA: Insufficient documentation

## 2019-08-31 DIAGNOSIS — I4891 Unspecified atrial fibrillation: Secondary | ICD-10-CM | POA: Diagnosis not present

## 2019-08-31 DIAGNOSIS — F0280 Dementia in other diseases classified elsewhere without behavioral disturbance: Secondary | ICD-10-CM | POA: Diagnosis not present

## 2019-08-31 DIAGNOSIS — X31XXXA Exposure to excessive natural cold, initial encounter: Secondary | ICD-10-CM | POA: Diagnosis not present

## 2019-08-31 DIAGNOSIS — R778 Other specified abnormalities of plasma proteins: Secondary | ICD-10-CM | POA: Diagnosis present

## 2019-08-31 DIAGNOSIS — Z20828 Contact with and (suspected) exposure to other viral communicable diseases: Secondary | ICD-10-CM | POA: Diagnosis not present

## 2019-08-31 DIAGNOSIS — D72829 Elevated white blood cell count, unspecified: Secondary | ICD-10-CM | POA: Insufficient documentation

## 2019-08-31 DIAGNOSIS — B9620 Unspecified Escherichia coli [E. coli] as the cause of diseases classified elsewhere: Secondary | ICD-10-CM | POA: Diagnosis present

## 2019-08-31 DIAGNOSIS — G3183 Dementia with Lewy bodies: Secondary | ICD-10-CM | POA: Diagnosis not present

## 2019-08-31 DIAGNOSIS — A419 Sepsis, unspecified organism: Secondary | ICD-10-CM | POA: Diagnosis present

## 2019-08-31 DIAGNOSIS — I10 Essential (primary) hypertension: Secondary | ICD-10-CM | POA: Diagnosis not present

## 2019-08-31 DIAGNOSIS — F0390 Unspecified dementia without behavioral disturbance: Secondary | ICD-10-CM

## 2019-08-31 DIAGNOSIS — S8990XA Unspecified injury of unspecified lower leg, initial encounter: Secondary | ICD-10-CM

## 2019-08-31 HISTORY — DX: Aphasia: R47.01

## 2019-08-31 HISTORY — DX: Unspecified lack of coordination: R27.9

## 2019-08-31 HISTORY — DX: Chronic obstructive pulmonary disease, unspecified: J44.9

## 2019-08-31 LAB — CBC WITH DIFFERENTIAL/PLATELET
Abs Immature Granulocytes: 0.19 10*3/uL — ABNORMAL HIGH (ref 0.00–0.07)
Abs Immature Granulocytes: 0.1 10*3/uL — ABNORMAL HIGH (ref 0.00–0.07)
Basophils Absolute: 0.2 10*3/uL — ABNORMAL HIGH (ref 0.0–0.1)
Basophils Absolute: 0.1 10*3/uL (ref 0.0–0.1)
Basophils Relative: 1 %
Basophils Relative: 0 %
Eosinophils Absolute: 0.1 10*3/uL (ref 0.0–0.5)
Eosinophils Absolute: 0 10*3/uL (ref 0.0–0.5)
Eosinophils Relative: 0 %
Eosinophils Relative: 0 %
HCT: 43.6 % (ref 36.0–46.0)
HCT: 38.2 % (ref 36.0–46.0)
Hemoglobin: 14 g/dL (ref 12.0–15.0)
Hemoglobin: 12.7 g/dL (ref 12.0–15.0)
Immature Granulocytes: 1 %
Immature Granulocytes: 1 %
Lymphocytes Relative: 13 %
Lymphocytes Relative: 5 %
Lymphs Abs: 3.1 10*3/uL (ref 0.7–4.0)
Lymphs Abs: 0.9 10*3/uL (ref 0.7–4.0)
MCH: 29 pg (ref 26.0–34.0)
MCH: 29.6 pg (ref 26.0–34.0)
MCHC: 32.1 g/dL (ref 30.0–36.0)
MCHC: 33.2 g/dL (ref 30.0–36.0)
MCV: 90.5 fL (ref 80.0–100.0)
MCV: 89 fL (ref 80.0–100.0)
Monocytes Absolute: 1.1 10*3/uL — ABNORMAL HIGH (ref 0.1–1.0)
Monocytes Absolute: 1.2 10*3/uL — ABNORMAL HIGH (ref 0.1–1.0)
Monocytes Relative: 5 %
Monocytes Relative: 7 %
Neutro Abs: 19.5 10*3/uL — ABNORMAL HIGH (ref 1.7–7.7)
Neutro Abs: 16.5 10*3/uL — ABNORMAL HIGH (ref 1.7–7.7)
Neutrophils Relative %: 80 %
Neutrophils Relative %: 87 %
Platelets: 252 10*3/uL (ref 150–400)
Platelets: 204 10*3/uL (ref 150–400)
RBC: 4.82 MIL/uL (ref 3.87–5.11)
RBC: 4.29 MIL/uL (ref 3.87–5.11)
RDW: 12.5 % (ref 11.5–15.5)
RDW: 12.5 % (ref 11.5–15.5)
WBC: 24.1 10*3/uL — ABNORMAL HIGH (ref 4.0–10.5)
WBC: 18.8 10*3/uL — ABNORMAL HIGH (ref 4.0–10.5)
nRBC: 0 % (ref 0.0–0.2)
nRBC: 0 % (ref 0.0–0.2)

## 2019-08-31 LAB — COMPREHENSIVE METABOLIC PANEL
ALT: 15 U/L (ref 0–44)
AST: 32 U/L (ref 15–41)
Albumin: 3.7 g/dL (ref 3.5–5.0)
Alkaline Phosphatase: 55 U/L (ref 38–126)
Anion gap: 10 (ref 5–15)
BUN: 26 mg/dL — ABNORMAL HIGH (ref 8–23)
CO2: 23 mmol/L (ref 22–32)
Calcium: 8.5 mg/dL — ABNORMAL LOW (ref 8.9–10.3)
Chloride: 107 mmol/L (ref 98–111)
Creatinine, Ser: 0.97 mg/dL (ref 0.44–1.00)
GFR calc Af Amer: >60 mL/min (ref >60)
GFR calc non Af Amer: 52 mL/min — ABNORMAL LOW (ref >60)
Glucose, Bld: 107 mg/dL — ABNORMAL HIGH (ref 70–99)
Potassium: 4.5 mmol/L (ref 3.5–5.1)
Sodium: 140 mmol/L (ref 135–145)
Total Bilirubin: 0.8 mg/dL (ref 0.3–1.2)
Total Protein: 6.2 g/dL — ABNORMAL LOW (ref 6.5–8.1)

## 2019-08-31 LAB — URINALYSIS, COMPLETE (UACMP) WITH MICROSCOPIC
Bilirubin Urine: NEGATIVE
Glucose, UA: NEGATIVE mg/dL
Hgb urine dipstick: NEGATIVE
Ketones, ur: NEGATIVE mg/dL
Nitrite: NEGATIVE
Protein, ur: NEGATIVE mg/dL
Specific Gravity, Urine: 1.01 (ref 1.005–1.030)
Squamous Epithelial / HPF: NONE SEEN (ref 0–5)
pH: 7 (ref 5.0–8.0)

## 2019-08-31 LAB — COMPREHENSIVE METABOLIC PANEL WITH GFR
ALT: 14 U/L (ref 0–44)
AST: 27 U/L (ref 15–41)
Albumin: 4.2 g/dL (ref 3.5–5.0)
Alkaline Phosphatase: 65 U/L (ref 38–126)
Anion gap: 16 — ABNORMAL HIGH (ref 5–15)
BUN: 26 mg/dL — ABNORMAL HIGH (ref 8–23)
CO2: 22 mmol/L (ref 22–32)
Calcium: 9.6 mg/dL (ref 8.9–10.3)
Chloride: 104 mmol/L (ref 98–111)
Creatinine, Ser: 1.18 mg/dL — ABNORMAL HIGH (ref 0.44–1.00)
GFR calc Af Amer: 48 mL/min — ABNORMAL LOW (ref >60)
GFR calc non Af Amer: 41 mL/min — ABNORMAL LOW (ref >60)
Glucose, Bld: 160 mg/dL — ABNORMAL HIGH (ref 70–99)
Potassium: 4.4 mmol/L (ref 3.5–5.1)
Sodium: 142 mmol/L (ref 135–145)
Total Bilirubin: 1.1 mg/dL (ref 0.3–1.2)
Total Protein: 7.2 g/dL (ref 6.5–8.1)

## 2019-08-31 LAB — TROPONIN I (HIGH SENSITIVITY)
Troponin I (High Sensitivity): 20 ng/L — ABNORMAL HIGH (ref <18)
Troponin I (High Sensitivity): 81 ng/L — ABNORMAL HIGH (ref <18)
Troponin I (High Sensitivity): 161 ng/L (ref <18)
Troponin I (High Sensitivity): 192 ng/L (ref <18)
Troponin I (High Sensitivity): 186 ng/L (ref <18)

## 2019-08-31 LAB — LIPASE, BLOOD: Lipase: 21 U/L (ref 11–51)

## 2019-08-31 LAB — CK
Total CK: 327 U/L — ABNORMAL HIGH (ref 38–234)
Total CK: 402 U/L — ABNORMAL HIGH (ref 38–234)

## 2019-08-31 LAB — LACTIC ACID, PLASMA
Lactic Acid, Venous: 2.4 mmol/L (ref 0.5–1.9)
Lactic Acid, Venous: 1.8 mmol/L (ref 0.5–1.9)

## 2019-08-31 LAB — MAGNESIUM: Magnesium: 2.1 mg/dL (ref 1.7–2.4)

## 2019-08-31 LAB — PROCALCITONIN: Procalcitonin: 3.24 ng/mL

## 2019-08-31 LAB — SARS CORONAVIRUS 2 (TAT 6-24 HRS): SARS Coronavirus 2: NEGATIVE

## 2019-08-31 MED ORDER — SODIUM CHLORIDE 0.9 % IV BOLUS
500.0000 mL | Freq: Once | INTRAVENOUS | Status: AC
  Administered 2019-08-31: 500 mL via INTRAVENOUS

## 2019-08-31 MED ORDER — SODIUM CHLORIDE 0.9 % IV SOLN
1.0000 g | INTRAVENOUS | Status: DC
  Administered 2019-08-31: 1 g via INTRAVENOUS
  Filled 2019-08-31: qty 10

## 2019-08-31 MED ORDER — PANTOPRAZOLE SODIUM 40 MG PO TBEC
40.0000 mg | DELAYED_RELEASE_TABLET | Freq: Every day | ORAL | Status: DC
  Administered 2019-08-31 – 2019-09-01 (×2): 40 mg via ORAL
  Filled 2019-08-31 (×2): qty 1

## 2019-08-31 MED ORDER — ATORVASTATIN CALCIUM 20 MG PO TABS: 20.0000 mg | ORAL_TABLET | Freq: Every day | ORAL | Status: DC

## 2019-08-31 MED ORDER — ALPRAZOLAM 0.5 MG PO TABS
0.5000 mg | ORAL_TABLET | Freq: Two times a day (BID) | ORAL | Status: DC
  Administered 2019-08-31 – 2019-09-01 (×2): 0.5 mg via ORAL
  Filled 2019-08-31 (×2): qty 1

## 2019-08-31 MED ORDER — MELATONIN 5 MG PO TABS
2.5000 mg | ORAL_TABLET | Freq: Every day | ORAL | Status: DC
  Filled 2019-08-31 (×2): qty 0.5

## 2019-08-31 MED ORDER — CALCIUM CARBONATE 1250 (500 CA) MG PO TABS
1.0000 | ORAL_TABLET | Freq: Every day | ORAL | Status: DC
  Administered 2019-09-01: 08:00:00 500 mg via ORAL
  Filled 2019-08-31: qty 1

## 2019-08-31 MED ORDER — OCUVITE-LUTEIN PO CAPS
1.0000 | ORAL_CAPSULE | Freq: Every day | ORAL | Status: DC
  Filled 2019-08-31: qty 1

## 2019-08-31 MED ORDER — SENNOSIDES-DOCUSATE SODIUM 8.6-50 MG PO TABS: 2.0000 | ORAL_TABLET | Freq: Every evening | ORAL | Status: DC | PRN

## 2019-08-31 MED ORDER — ONDANSETRON HCL 4 MG/2ML IJ SOLN: 4.0000 mg | Freq: Four times a day (QID) | INTRAMUSCULAR | Status: DC | PRN

## 2019-08-31 MED ORDER — SODIUM CHLORIDE 0.9 % IV SOLN
Freq: Once | INTRAVENOUS | Status: AC
  Administered 2019-08-31: 10:00:00 via INTRAVENOUS

## 2019-08-31 MED ORDER — ONDANSETRON HCL 4 MG PO TABS: 4.0000 mg | ORAL_TABLET | Freq: Four times a day (QID) | ORAL | Status: DC | PRN

## 2019-08-31 MED ORDER — ACETAMINOPHEN 325 MG PO TABS: 650.0000 mg | ORAL_TABLET | Freq: Four times a day (QID) | ORAL | Status: DC | PRN

## 2019-08-31 MED ORDER — SODIUM CHLORIDE 0.9 % IV SOLN
INTRAVENOUS | Status: DC
  Administered 2019-08-31 – 2019-09-01 (×3): via INTRAVENOUS

## 2019-08-31 MED ORDER — PAROXETINE HCL 30 MG PO TABS
30.0000 mg | ORAL_TABLET | Freq: Every day | ORAL | Status: DC
  Administered 2019-09-01: 08:00:00 30 mg via ORAL
  Filled 2019-08-31 (×2): qty 1

## 2019-08-31 MED ORDER — ALPRAZOLAM 0.5 MG PO TABS: 0.5000 mg | ORAL_TABLET | Freq: Two times a day (BID) | ORAL | Status: DC

## 2019-08-31 MED ORDER — ALBUTEROL SULFATE (2.5 MG/3ML) 0.083% IN NEBU: 2.5000 mg | INHALATION_SOLUTION | RESPIRATORY_TRACT | Status: DC | PRN

## 2019-08-31 MED ORDER — ENOXAPARIN SODIUM 40 MG/0.4ML ~~LOC~~ SOLN
40.0000 mg | SUBCUTANEOUS | Status: DC
  Administered 2019-08-31: 40 mg via SUBCUTANEOUS
  Filled 2019-08-31: qty 0.4

## 2019-08-31 MED ORDER — TRAZODONE HCL 50 MG PO TABS: 50.0000 mg | ORAL_TABLET | Freq: Every day | ORAL | Status: DC

## 2019-08-31 MED ORDER — TRAMADOL HCL 50 MG PO TABS: 50.0000 mg | ORAL_TABLET | Freq: Two times a day (BID) | ORAL | Status: DC | PRN

## 2019-08-31 MED ORDER — ACETAMINOPHEN 650 MG RE SUPP: 650.0000 mg | Freq: Four times a day (QID) | RECTAL | Status: DC | PRN

## 2019-08-31 NOTE — ED Provider Notes (Addendum)
Patient's mental status is improving she is talking and moving all extremities equally and well.  Her white count has gone down from 24,000 18,000 however her lactic acid is up and her troponin is gone up.  I think in view of this we need to watch her in the hospital.   Nena Polio, MD 08/31/19 1005 Repeat EKG shows sinus tach at a rate of 101 left axis no acute ST-T segment changes patient does have right bundle branch block in the computer reading left anterior hemiblock.  Looks like there is first-degree block as well.   Nena Polio, MD 08/31/19 1105

## 2019-08-31 NOTE — ED Notes (Signed)
Pt taken off bair hugger at this time.

## 2019-08-31 NOTE — H&P (Signed)
History and Physical    Shirley Villanueva DOB: [birth year] DOA: 08/31/2019  Referring MD/NP/PA:   PCP: Patient, No Pcp Per   Patient coming from:  The patient is coming from [place] facility. At baseline, pt is dependent for most of ADL.        Chief Complaint: Hypothermia due to cold exposure  HPI: Shirley Villanueva is a 85 y.o. female with medical history significant of hypertension, COPD, GERD, depression with anxiety, Lewy body dementia, who presents with hypothermia due to cold exposure.  I tried to call family twice without success. Patient has dementia and is unable to provide accurate medical history, therefore, most of the history is obtained by discussing the case with ED physician, per EMS report, and with the nursing staff.  Per report, paramedics were told that the patient was found outside of [name] facility after she had been out for an unknown period of time after apparently leaving the facility. She was very cold to the touch.  The facility moved her inside in a wheelchair, but were unable to get her back into the bed. She was on the floor when EMS arrived.  They brought her immediately to the emergency department and were unable to obtain a temperature. Her initial body temperature was 89.5, which improved to 95.7, then 99.6 with a saw patient by ED. The patient is pale and shivering upon arrival. When I saw pt in ED, she is alert, seems to know her own name, but not oriented to place and time. She moves all extremities.  Does not seem to have chest pain or abdominal pain.  No active respiratory distress, nausea, vomiting or diarrhea noted.  She has bruise in both knee areas. No sure if patient has symptoms of UTI.  ED Course: pt was found to have trop 20 -->81 -->161 -->192.  Positive urinalysis for UTI, CK 327, 402, pending COVID-19 test, WBC 18.8, lactic acid 2.4, electrolytes renal function okay, blood pressure 180/62 --> 117/83, tachycardia,  oxygen saturation 100% on 2L oxygen.  Chest x-ray showed small amount of left pleural effusion.  X-ray of bilateral knee negative for bony fracture.  CT head is negative for acute intracranial abnormality.  Patient is placed on MedSurg bed of observation.  Cardiology was consulted.   Review of Systems: Could not be reviewed due to dementia  Allergy: No Known Allergies  Past Medical History:  Diagnosis Date  . Aphasia   . COPD (chronic obstructive pulmonary disease) (HCC)   . Lack of coordination   . Lewy body dementia (HCC)     History reviewed. No pertinent surgical history. Could not be reviewed due to dementia  Social History:  reports that she has never smoked. She has never used smokeless tobacco. She reports that she does not drink alcohol or use drugs.  Family History: History reviewed. No pertinent family history. Could not be reviewed due to dementia  Prior to Admission medications   Not on File    Physical Exam: Vitals:   08/31/19 1342 08/31/19 1358 08/31/19 1421 08/31/19 1553  BP: (!) 152/102  (!) 155/111 137/67  Pulse: 100  (!) 101 78  Resp: Temp:  99.7 F (37.6 C)    TempSrc:  Rectal    SpO2: 100%  100% 100%   General: Not in acute distress HEENT:       Eyes: PERRL, EOMI, no scleral icterus.       ENT: No discharge from  the ears and nose, no pharynx injection, no tonsillar enlargement.        Neck: No JVD, no bruit, no mass felt. Heme: No neck lymph node enlargement. Cardiac: S1/S2, RRR, No murmurs, No gallops or rubs. Respiratory:  No rales, wheezing, rhonchi or rubs. GI: Soft, nondistended, nontender, no organomegaly, BS present. GU: No hematuria Ext: No pitting leg edema bilaterally. 2+DP/PT pulse bilaterally. Musculoskeletal: No joint deformities, No joint redness or warmth, no limitation of ROM in spin. Skin: has bruise in both knee areas. Neuro: seems to know her own name, but not oriented to place and time. cranial nerves II-XII grossly  intact, moves all extremities Psych: Patient is not psychotic, no suicidal or hemocidal ideation.  Labs on Admission: I have personally reviewed following labs and imaging studies  CBC: Recent Labs  Lab 08/31/19 0623 08/31/19 0859  WBC 24.1* 18.8*  NEUTROABS 19.5* 16.5*  HGB 14.0 12.7  HCT 43.6 38.2  MCV 90.5 89.0  PLT 252 204   Basic Metabolic Panel: Recent Labs  Lab 08/31/19 0623 08/31/19 0859  NA 142 140  K 4.4 4.5  CL 104 107  CO2 22 23  GLUCOSE 160* 107*  BUN 26* 26*  CREATININE 1.18* 0.97  CALCIUM 9.6 8.5*  MG 2.1  --    GFR: CrCl cannot be calculated (Unknown ideal weight.). Liver Function Tests: Recent Labs  Lab 08/31/19 0623 08/31/19 0859  AST 27 32  ALT 14 15  ALKPHOS 65 55  BILITOT 1.1 0.8  PROT 7.2 6.2*  ALBUMIN 4.2 3.7   Recent Labs  Lab 08/31/19 0859  LIPASE 21   No results for input(s): AMMONIA in the last 168 hours. Coagulation Profile: No results for input(s): INR, PROTIME in the last 168 hours. Cardiac Enzymes: Recent Labs  Lab 08/31/19 0623 08/31/19 0859  CKTOTAL 327* 402*   BNP (last 3 results) No results for input(s): PROBNP in the last 8760 hours. HbA1C: No results for input(s): HGBA1C in the last 72 hours. CBG: No results for input(s): GLUCAP in the last 168 hours. Lipid Profile: No results for input(s): CHOL, HDL, LDLCALC, TRIG, CHOLHDL, LDLDIRECT in the last 72 hours. Thyroid Function Tests: No results for input(s): TSH, T4TOTAL, FREET4, T3FREE, THYROIDAB in the last 72 hours. Anemia Panel: No results for input(s): VITAMINB12, FOLATE, FERRITIN, TIBC, IRON, RETICCTPCT in the last 72 hours. Urine analysis:    Component Value Date/Time   COLORURINE YELLOW (A) 08/31/2019 0859   APPEARANCEUR CLOUDY (A) 08/31/2019 0859   APPEARANCEUR Clear 05/26/2013 1604   LABSPEC 1.010 08/31/2019 0859   LABSPEC 1.017 05/26/2013 1604   PHURINE 7.0 08/31/2019 0859   GLUCOSEU NEGATIVE 08/31/2019 0859   GLUCOSEU Negative 05/26/2013  1604   HGBUR NEGATIVE 08/31/2019 0859   BILIRUBINUR NEGATIVE 08/31/2019 0859   BILIRUBINUR Negative 05/26/2013 1604   KETONESUR NEGATIVE 08/31/2019 0859   PROTEINUR NEGATIVE 08/31/2019 0859   NITRITE NEGATIVE 08/31/2019 0859   LEUKOCYTESUR MODERATE (A) 08/31/2019 0859   LEUKOCYTESUR Negative 05/26/2013 1604   Sepsis Labs: @LABRCNTIP (procalcitonin:4,lacticidven:4) ) Recent Results (from the past 240 hour(s))  SARS CORONAVIRUS 2 (TAT 6-24 HRS) Nasopharyngeal Nasopharyngeal Swab     Status: None   Collection Time: 08/31/19 10:07 AM   Specimen: Nasopharyngeal Swab  Result Value Ref Range Status   SARS Coronavirus 2 NEGATIVE NEGATIVE Final    Comment: (NOTE) SARS-CoV-2 target nucleic acids are NOT DETECTED. The SARS-CoV-2 RNA is generally detectable in upper and lower respiratory specimens during the acute phase of infection. Negative results  do not preclude SARS-CoV-2 infection, do not rule out co-infections with other pathogens, and should not be used as the sole basis for treatment or other patient management decisions. Negative results must be combined with clinical observations, patient history, and epidemiological information. The expected result is Negative. Fact Sheet for Patients: HairSlick.no Fact Sheet for Healthcare Providers: quierodirigir.com This test is not yet approved or cleared by the [place] FDA and  has been authorized for detection and/or diagnosis of SARS-CoV-2 by FDA under an Emergency Use Authorization (EUA). This EUA will remain  in effect (meaning this test can be used) for the duration of the COVID-19 declaration under Section 56 4(b)(1) of the Act, 21 U.S.C. section 360bbb-3(b)(1), unless the authorization is terminated or revoked sooner. Performed at [facility], [address], [place], [place] [address]      Radiological Exams on Admission: Dg Knee 1-2 Views Left  Result Date:  08/31/2019 CLINICAL DATA:  Larey Seat this morning. Left knee pain. EXAM: LEFT KNEE - 1-2 VIEW COMPARISON:  None. FINDINGS: Advanced lateral compartment degenerative changes are noted. No acute fracture is identified. No joint effusion. Intramedullary rod and distal interlocking screw noted in the distal femur. Soft tissue thickening over the region the patellar tendon likely focal contusion or prepatellar bursitis. IMPRESSION: 1. Advanced lateral compartment degenerative changes. 2. No acute fracture or joint effusion. 3. Soft tissue thickening over the region of the patellar tendon likely contusion or prepatellar bursitis. Electronically Signed   By: Rudie Meyer M.D.   On: 08/31/2019 13:14   Dg Knee 1-2 Views Right  Result Date: 08/31/2019 CLINICAL DATA:  Larey Seat. Knee pain. EXAM: RIGHT KNEE - 1-2 VIEW COMPARISON:  None. FINDINGS: Mild tricompartmental degenerative changes. No definite acute fracture or joint effusion. IMPRESSION: Tricompartmental degenerative changes but no acute fracture or joint effusion. Electronically Signed   By: Rudie Meyer M.D.   On: 08/31/2019 13:15   Ct Head Wo Contrast  Result Date: 08/31/2019 CLINICAL DATA:  Lewy body dementia, hypothermia EXAM: CT HEAD WITHOUT CONTRAST TECHNIQUE: Contiguous axial images were obtained from the base of the skull through the vertex without intravenous contrast. COMPARISON:  07/20/2018 FINDINGS: Brain: Diffuse parenchymal atrophy. Patchy areas of hypoattenuation in deep and periventricular white matter bilaterally. Negative for acute intracranial hemorrhage, mass lesion, acute infarction, midline shift, or mass-effect. Acute infarct may be inapparent on noncontrast CT. Ventricles and sulci symmetric. Vascular: Atherosclerotic and physiologic intracranial calcifications. Skull: Normal. Negative for fracture or focal lesion. Sinuses/Orbits: No acute finding. Other: None IMPRESSION: 1. Brain atrophy and chronic microvascular ischemic changes. 2. No acute  intracranial abnormality by noncontrast CT. Electronically Signed   By: Corlis Leak M.D.   On: 08/31/2019 09:19   Dg Chest Portable 1 View  Result Date: 08/31/2019 CLINICAL DATA:  Hypothermia. Rectal temperature of 88 degrees. History of Lue E body dementia. EXAM: PORTABLE CHEST 1 VIEW COMPARISON:  05/26/2013 FINDINGS: Normal heart size. There is blunting of the left costophrenic angle compatible with small effusion. Atelectasis noted in the left lung base. No airspace consolidation identified. IMPRESSION: 1. Small left pleural effusion. 2. Left base atelectasis. Electronically Signed   By: Signa Kell M.D.   On: 08/31/2019 09:15     EKG: Independently reviewed.  First EKG has poor quality which is difficult to read. The second EKG is also in poor quality.  The third repeated EKG showed sinus rhythm, QTC 471, LAD, bifascicular block, poor R wave progression   Assessment/Plan Principal Problem:   Hypothermia  Active Problems:   Lewy body dementia (HCC)   COPD (chronic obstructive pulmonary disease) (HCC)   Cold exposure   Elevated troponin   HLD (hyperlipidemia)   Sepsis (HCC)   UTI (urinary tract infection)   Hypothermia due to cold exposure: Hypothermia has resolved.   -will place on med-surg bed for obs - prn Bair Hugger  Sepsis possibly due to UTI: Patient has fever, tachycardia and leukocytosis.  Has elevated lactic acid 2.4. -rocephin IV -f/u Bx and Ux -will get Procalcitonin and trend lactic acid levels per sepsis protocol. -IVF: 500 of NS bolus in ED, followed by 100 cc/h   Lewy body dementia ([place]): No behavior disturbance -monitoring  COPD (chronic obstructive pulmonary disease) ([place]): stable -As needed albuterol  Elevated troponin:  trop 20 -->81 -->161 -->192.  Possibly due to demand ischemia, since troponin is trending up.  Cardiology was consulted.  Message sent to Dr. Humphrey Rolls - Trend Trop - Repeat EKG in the am  - aspirin, lipitor  - Risk factor stratification:  will check FLP and A1C  - 2d echo  HLD (hyperlipidemia): -lipitor   DVT ppx: SQ Lovenox Code Status: Full code Family Communication: None at bed side.  I tried to call family twice without success.    Disposition Plan:  Anticipate discharge back to previous  [place] called: Card, Dr. Humphrey Rolls Admission status:med-surg for obs  Date of Service 08/31/2019    [facility]   If 7PM-7AM, please contact night-coverage [email or web address] Password [facility] 08/31/2019, 5:45 PM

## 2019-08-31 NOTE — ED Notes (Signed)
Warm IV fluids administered per MD request.

## 2019-08-31 NOTE — ED Notes (Addendum)
Pt brief and chuck changed d/t small amount of brown stool. Rolling pt and cleaning pt made pt become agitated. Mittens placed on hands d/t pt trying to take things off. When pt is awake she is very fidegty and tries to sit up and move around, throws legs over side rails. When lights are dimmed and staff step away from pt she settles down.   Pt drank about 8oz of water at this time.

## 2019-08-31 NOTE — ED Notes (Addendum)
Date and time results received: 08/31/19 1322   Test: troponin Critical Value: 161  Name of Provider Notified: Dr. Blaine Hamper

## 2019-08-31 NOTE — ED Notes (Signed)
X-ray at bedside

## 2019-08-31 NOTE — ED Triage Notes (Addendum)
Pt to ED via [facility] from [facility]. Per EMS facility reported pt was not in room for 0400 VS check. Pt was found outside. EMS was called and arrived at 58. Upon arrival facility had moved pt inside via wheelchair. EMS stated pt was on the floor when they arrived. EMS unable to obtain temperature.   Upon arrival pt rectal temp 88.0. Pt found with bruises on both knees.

## 2019-08-31 NOTE — ED Provider Notes (Signed)
[facility] Emergency Department Provider Note  ____________________________________________   First MD Initiated Contact with Patient 08/31/19 0615     (approximate)  I have reviewed the triage vital signs and the nursing notes.   HISTORY  Chief Complaint Cold Exposure  Level 5 caveat:  history/ROS limited by acute/critical illness as well as chronic dementia.  HPI Shirley Villanueva is a 85 y.o. female who reportedly has a history of Lewy body dementia and possibly mild COPD who is brought by EMS for evaluation of cold exposure.  History is limited but the paramedics were told that the patient was found outside of [name] facility after she had been out for an unknown period of time after apparently leaving the facility of her own volition.  She had a diminished mental status from baseline and was very cold to the touch.  The facility moved her inside in a wheelchair but were unable to get her back into the bed and she was on the floor when EMS arrived.  They brought her immediately to the emergency department and were unable to obtain a temperature.  The patient is pale and shivering upon arrival.  Her eyes are open and she is awake but she is not initially responding to stimuli.         Past Medical History:  Diagnosis Date   Aphasia    COPD (chronic obstructive pulmonary disease) (HCC)    Lack of coordination    Lewy body dementia (HCC)     There are no active problems to display for this patient.   History reviewed. No pertinent surgical history.  Prior to Admission medications   Not on File    Allergies Patient has no known allergies.  History reviewed. No pertinent family history.  Social History Social History   Tobacco Use   Smoking status: Never Smoker   Smokeless tobacco: Never Used  Substance Use Topics   Alcohol use: Never    Frequency: Never   Drug use: Never    Review of Systems Level 5  caveat:  history/ROS limited by acute/critical illness as well as chronic dementia.   ____________________________________________   PHYSICAL EXAM:  VITAL SIGNS: ED Triage Vitals  Enc Vitals Group     BP 08/31/19 0605 (!) 162/131     Pulse Rate 08/31/19 0608 100     Resp 08/31/19 0608 18     Temp 08/31/19 0608 (!) 88 F (31.1 C)     Temp Source 08/31/19 0608 Rectal     SpO2 08/31/19 0608 98 %     Weight --      Height --      Head Circumference --      Peak Flow --      Pain Score --      Pain Loc --      Pain Edu? --      Excl. in GC? --     Constitutional: Awake but not responding to stimuli, cold to the touch and shivering. Eyes: Conjunctivae are normal.  Head: Atraumatic. Nose: No congestion/rhinnorhea. Mouth/Throat: Patient's oral mucosas are tacky Neck: No stridor.  No meningeal signs.   Cardiovascular: Borderline tachycardia, regular rhythm.  Poor peripheral circulation with extremely cold extremities. Respiratory: Normal respiratory effort.  No retractions. Gastrointestinal: Soft and nontender. No distention.  Musculoskeletal: No lower extremity tenderness nor edema. No gross deformities of extremities except for ecchymoses or fluid accumulation of both knees. Neurologic:  Normal speech and language. No  gross focal neurologic deficits are appreciated.  Skin: Patient's fingernails are dirty and cyanotic.  She has erythema consistent possibly with pressure wounds of both of her knees, ecchymoses are also possible.   ____________________________________________   LABS (all labs ordered are listed, but only abnormal results are displayed)  Labs Reviewed  CBC WITH DIFFERENTIAL/PLATELET - Abnormal; Notable for the following components:      Result Value   WBC 24.1 (*)    Neutro Abs 19.5 (*)    Monocytes Absolute 1.1 (*)    Basophils Absolute 0.2 (*)    Abs Immature Granulocytes 0.19 (*)    All other components within normal limits  COMPREHENSIVE METABOLIC  PANEL - Abnormal; Notable for the following components:   Glucose, Bld 160 (*)    BUN 26 (*)    Creatinine, Ser 1.18 (*)    GFR calc non Af Amer 41 (*)    GFR calc Af Amer 48 (*)    Anion gap 16 (*)    All other components within normal limits  CK - Abnormal; Notable for the following components:   Total CK 327 (*)    All other components within normal limits  TROPONIN I (HIGH SENSITIVITY) - Abnormal; Notable for the following components:   Troponin I (High Sensitivity) 20 (*)    All other components within normal limits  MAGNESIUM   ____________________________________________  EKG  ED ECG REPORT I, Hinda Kehr, the attending physician, personally viewed and interpreted this ECG.  Date: 08/31/2019 EKG Time: 6:18 AM Rate: 112 Rhythm: A. fib with RVR versus sinus tachycardia, difficult to appreciate due to heavy artifact QRS Axis: normal Intervals: normal ST/T Wave abnormalities: Non-specific ST segment / T-wave changes, but no clear evidence of acute ischemia.  EKG is very difficult to interpret due to the patient's shivering and the resulting artifact.  No clear indication of Osborne waves. Narrative Interpretation: Non-specific ST segment / T-wave changes, but no clear evidence of acute ischemia.  EKG is very difficult to interpret due to the patient's shivering and the resulting artifact.  No clear indication of Osborne waves.    ____________________________________________  RADIOLOGY Ursula Alert, personally viewed and evaluated these images (plain radiographs) as part of my medical decision making, as well as reviewing the written report by the radiologist.  ED MD interpretation: No indication for emergent imaging at this time.  Official radiology report(s): No results found.  ____________________________________________   PROCEDURES   Procedure(s) performed (including Critical Care):  .Critical Care Performed by: Hinda Kehr, MD Authorized by:  Hinda Kehr, MD   Critical care provider statement:    Critical care time (minutes):  45   Critical care time was exclusive of:  Separately billable procedures and treating other patients   Critical care was necessary to treat or prevent imminent or life-threatening deterioration of the following conditions: environmental exposure.   Critical care was time spent personally by me on the following activities:  Development of treatment plan with patient or surrogate, discussions with consultants, evaluation of patient's response to treatment, examination of patient, obtaining history from patient or surrogate, ordering and performing treatments and interventions, ordering and review of laboratory studies, ordering and review of radiographic studies, pulse oximetry, re-evaluation of patient's condition and review of old charts     ____________________________________________   [place] / MDM / [place] / ED COURSE  As part of my medical decision making, I reviewed the following data within the [facility] notes  reviewed and incorporated, Labs reviewed , EKG interpreted , Old chart reviewed, Patient signed out to Dr. Darnelle CatalanMalinda, Discussed with representative at the patient's living facility, discussed with [name] representative Fleet ContrasRachel [phone]) and reviewed Notes from prior ED visits   Differential diagnosis includes, but is not limited to, environmental hypothermia, traumatic injury, sepsis.  Bowel indications from with a limited history available the patient was at her normal baseline status when she reportedly walked out of the living facility of her own accord.  She was found an unknown amount of time later but sufficiently low to drop her rectal temperature to 88 degrees (it is a clear night tonight with no precipitation but the temperature is in the low 40s).  The patient does not have wet clothing upon arrival but is clearly very cold particularly in  her extremities.  She has what appears to be some fluid collection possibly from pressure on her knees but no obvious traumatic injuries.  Given her moderate hypothermia, my plan is to rewarm her relatively slowly with the Bair hugger and a 500 mL normal saline warmed fluid bolus.  I had her placed on the Zoll monitor with cardiac pads given the concern for myocardial irritability and possible fatal dysrhythmias.  EKG is difficult to interpret due to her shivering.  I sent a broad spectrum of lab work and we will monitor the patient carefully as she slowly rewarms.  Anticipate possible head CT given the unclear circumstances tonight after the patient is more stable and warmed and out of the initial period of danger of dysrhythmia.  Very limited information was available from the paramedics as provided by the living facility but she came with no documentation.  I will contact [name] to try to find more information including CODE STATUS, next of kin or healthcare power of attorney, etc.      Clinical Course as of Aug 31 735  Sun Aug 31, 2019  0644 I spoke by phone with a [facility] (I missed her name on the phone).  She checked in her documentation and verified with another staff member that knows her and said that the patient does not have a DNR order.  She said that she would fax me the patient's medication list, healthcare history, and contact the list including the best contact person and/or healthcare power of attorney.  She was provided with our fax number, [phone].   [CF]  [phone] The representative also reported a medical history of COPD, aphasia, lack of coordination, and Lewy body dementia.   [CF]  [phone] I found a note in the computer indicating that the patient has a legal guardian which is the Department of Social Services.  Because it is after hours and on the weekend, I had to call the [place] [employer]'s  department at [phone] and have them page the adult services on-call social worker.  The representative at the sheriff's department said she would do so and call me back.   [CF]  [phone] Fleet ContrasRachel with the department of social services called me back.  We discussed the case and she is in the process of verifying information.  She will call the Adult Protective Services representative who will of access to information such as CODE STATUS and also make Adult Protective Services aware of the situation that led to the patient coming to the emergency department.  She said that someone will call me back.   [CF]  [phone] I spoke by phone  again with [name] representative Fleet Contras [phone]).  She verified in their documentation that the patient has no involved family and that she is full code.  [name] has been her guardian for at least 5 years.  Fleet Contras contacted Adult [employer] and let them know about the incident.  Fleet Contras asked that she be notified by ED staff when the patient's disposition is known.I am transferring ED care to Dr. Darnelle Catalan.  The patient's lab work currently is generally reassuring.  She has a leukocytosis which is likely reactive.  Mild elevation of CK at 327, mild troponin elevation at 20.  Comprehensive metabolic panel is generally reassuring.  The current plan is to continue to warm the patient, repeat lab work to see if improvement or worsening after rewarming and fluids, and consider obtaining a head CT after the patient is stable.   [CF]    Clinical Course User Index [CF] Loleta Rose, MD     ____________________________________________  FINAL CLINICAL IMPRESSION(S) / ED DIAGNOSES  Final diagnoses:  Hypothermia due to cold environment  Dementia without behavioral disturbance, unspecified dementia type [facility])     MEDICATIONS GIVEN DURING THIS VISIT:  Medications  sodium chloride 0.9 % bolus 500 mL (500 mLs Intravenous New Bag/Given 08/31/19 5621)     ED Discharge Orders      None      *Please note:  Shirley Villanueva was evaluated in Emergency Department on 08/31/2019 for the symptoms described in the history of present illness. She was evaluated in the context of the global COVID-19 pandemic, which necessitated consideration that the patient might be at risk for infection with the SARS-CoV-2 virus that causes COVID-19. Institutional protocols and algorithms that pertain to the evaluation of patients at risk for COVID-19 are in a state of rapid change based on information released by regulatory bodies including the CDC and federal and state organizations. These policies and algorithms were followed during the patient's care in the ED.  Some ED evaluations and interventions may be delayed as a result of limited staffing during the pandemic.*  Note:  This document was prepared using Dragon voice recognition software and may include unintentional dictation errors.   Loleta Rose, MD 08/31/19 [phone]

## 2019-08-31 NOTE — ED Notes (Signed)
Bear hugger removed from pt per MD Malinda orders

## 2019-08-31 NOTE — ED Notes (Signed)
Unable to obtain urine with in and out kit. MD aware. Verbal order for warm fluids at 226m/hr per MD Malinda. Fluids started at this time.

## 2019-09-01 ENCOUNTER — Observation Stay (HOSPITAL_BASED_OUTPATIENT_CLINIC_OR_DEPARTMENT_OTHER)
Admit: 2019-09-01 | Discharge: 2019-09-01 | Disposition: A | Discharge status: Home / Self Care | Payer: Medicare Other | Attending: Internal Medicine | Admitting: Internal Medicine

## 2019-09-01 DIAGNOSIS — T68XXXD Hypothermia, subsequent encounter: Secondary | ICD-10-CM

## 2019-09-01 DIAGNOSIS — I361 Nonrheumatic tricuspid (valve) insufficiency: Secondary | ICD-10-CM | POA: Diagnosis not present

## 2019-09-01 DIAGNOSIS — R778 Other specified abnormalities of plasma proteins: Secondary | ICD-10-CM | POA: Diagnosis not present

## 2019-09-01 DIAGNOSIS — T68XXXA Hypothermia, initial encounter: Secondary | ICD-10-CM | POA: Diagnosis not present

## 2019-09-01 DIAGNOSIS — N3000 Acute cystitis without hematuria: Secondary | ICD-10-CM | POA: Diagnosis not present

## 2019-09-01 LAB — LIPID PANEL
Cholesterol: 100 mg/dL (ref 0–200)
HDL: 45 mg/dL (ref >40)
LDL Cholesterol: 42 mg/dL (ref 0–99)
Total CHOL/HDL Ratio: 2.2 RATIO
Triglycerides: 65 mg/dL (ref <150)
VLDL: 13 mg/dL (ref 0–40)

## 2019-09-01 LAB — BASIC METABOLIC PANEL
Anion gap: 8 (ref 5–15)
BUN: 23 mg/dL (ref 8–23)
CO2: 23 mmol/L (ref 22–32)
Calcium: 8.1 mg/dL — ABNORMAL LOW (ref 8.9–10.3)
Chloride: 111 mmol/L (ref 98–111)
Creatinine, Ser: 1.02 mg/dL — ABNORMAL HIGH (ref 0.44–1.00)
GFR calc Af Amer: 57 mL/min — ABNORMAL LOW (ref >60)
GFR calc non Af Amer: 49 mL/min — ABNORMAL LOW (ref >60)
Glucose, Bld: 80 mg/dL (ref 70–99)
Potassium: 3.9 mmol/L (ref 3.5–5.1)
Sodium: 142 mmol/L (ref 135–145)

## 2019-09-01 LAB — CBC
HCT: 31.1 % — ABNORMAL LOW (ref 36.0–46.0)
Hemoglobin: 10.3 g/dL — ABNORMAL LOW (ref 12.0–15.0)
MCH: 29.1 pg (ref 26.0–34.0)
MCHC: 33.1 g/dL (ref 30.0–36.0)
MCV: 87.9 fL (ref 80.0–100.0)
Platelets: 171 10*3/uL (ref 150–400)
RBC: 3.54 MIL/uL — ABNORMAL LOW (ref 3.87–5.11)
RDW: 12.6 % (ref 11.5–15.5)
WBC: 10.8 10*3/uL — ABNORMAL HIGH (ref 4.0–10.5)
nRBC: 0 % (ref 0.0–0.2)

## 2019-09-01 LAB — ECHOCARDIOGRAM COMPLETE: Weight: 2031.76 oz

## 2019-09-01 LAB — HEMOGLOBIN A1C
Hgb A1c MFr Bld: 5.7 % — ABNORMAL HIGH (ref 4.8–5.6)
Mean Plasma Glucose: 116.89 mg/dL

## 2019-09-01 MED ORDER — AMOXICILLIN-POT CLAVULANATE 875-125 MG PO TABS: 1.0000 | ORAL_TABLET | Freq: Two times a day (BID) | ORAL | 0 refills | Status: AC

## 2019-09-01 NOTE — TOC Initial Note (Signed)
Transition of Care [facility]) - Initial/Assessment Note    Patient Details  Name: Shirley Villanueva MRN: [id number] Date of Birth: November 20, 1927  Transition of Care Shirley Vill[facility]va) CM/SW Contact:    Shelbie Hutching, RN Phone Number: 09/01/2019, 10:28 AM  Clinical Narrative:                 [facility].  [facility] owner informed RNCM that patient is a ward of [facility].  Patient has a history of dementia but is mostly independent in ADL's and walks without assistance.  Patient has been medically cleared and is ready for discharge back to [facility].  ALF will provide transportation they will be able to come and pick patient up this afternoon.  Patient's PCP is with [name], Shirley Villanueva is the NP that the patient sees frequently.  Patient has no discharge needs.   Expected Discharge Plan: Assisted Living Barriers to Discharge: No Barriers Identified   Patient Goals and CMS Choice        Expected Discharge Plan and Services Expected Discharge Plan: Assisted Living   Discharge Planning Services: CM Consult   Living arrangements for the past 2 months: Shirley Villanueva                                      Prior Living Arrangements/Services Living arrangements for the past 2 months: Shirley Villanueva Lives with:: Facility Resident Patient language and need for interpreter reviewed:: Yes Do you feel safe going back to the place where you live?: Yes      Need for Family Participation in Patient Care: No (Comment) Care giver support system in place?: Yes (comment)(Shirley Villanueva and [facility])   Criminal Activity/Legal Involvement Pertinent to Current Situation/Hospitalization: No - Comment as needed  Activities of Daily Living      Permission Sought/Granted Permission sought to share information with : Facility Sport and exercise psychologist, Guardian Permission granted to share information with : Yes, Verbal Permission Granted     Permission  granted to share info w AGENCY: Shirley Villanueva, [facility] Villanueva        Emotional Assessment       Orientation: : Oriented to Self Alcohol / Substance Use: Other (comment) Psych Involvement: No (comment)  Admission diagnosis:  Knee injury [S89.90XA] Dementia without behavioral disturbance, unspecified dementia type ([place]) [F03.90] Hypothermia due to cold environment [T68.XXXA] Patient Active Problem List   Diagnosis Date Noted  . HLD (hyperlipidemia) 08/31/2019  . Sepsis ([place]) 08/31/2019  . UTI (urinary tract infection) 08/31/2019  . Lewy body dementia ([place])   . COPD (chronic obstructive pulmonary disease) ([place])   . Hypothermia   . Cold exposure   . Elevated troponin   . Leucocytosis    PCP:  Patient, No Pcp Per Pharmacy:  No Pharmacies Listed    Social Determinants of Health (SDOH) Interventions    Readmission Risk Interventions No flowsheet data found.

## 2019-09-01 NOTE — Progress Notes (Signed)
*  PRELIMINARY RESULTS* Echocardiogram 2D Echocardiogram has been performed.  Shirley Villanueva 09/01/2019, 2:51 PM

## 2019-09-01 NOTE — NC FL2 (Signed)
[place] MEDICAID FL2 LEVEL OF CARE SCREENING TOOL     IDENTIFICATION  Patient Name: Shirley Villanueva Birthdate: 1928-08-15 Sex: female Admission Date (Current Location): 08/31/2019  [place] and [place] Number:  Chiropodist and Address:  [facility], [address], [place], [place] [address]      Provider Number: [id number]  Attending Physician Name and Address:  Pennie Banter, DO  Relative Name and Phone Number:       Current Level of Care: Hospital Recommended Level of Care: Assisted Living Facility Prior Approval Number:    Date Approved/Denied:   PASRR Number:    Discharge Plan: Other (Comment)(ALF)    Current Diagnoses: Patient Active Problem List   Diagnosis Date Noted  . HLD (hyperlipidemia) 08/31/2019  . Sepsis (HCC) 08/31/2019  . UTI (urinary tract infection) 08/31/2019  . Lewy body dementia (HCC)   . COPD (chronic obstructive pulmonary disease) (HCC)   . Hypothermia   . Cold exposure   . Elevated troponin   . Leucocytosis     Orientation RESPIRATION BLADDER Height & Weight     Self  Normal Continent Weight: 57.6 kg Height:     BEHAVIORAL SYMPTOMS/MOOD NEUROLOGICAL BOWEL NUTRITION STATUS      Continent Diet(Heart Healthy)  AMBULATORY STATUS COMMUNICATION OF NEEDS Skin   Supervision Verbally Normal                       Personal Care Assistance Level of Assistance  Bathing, Feeding, Dressing Bathing Assistance: Limited assistance Feeding assistance: Limited assistance Dressing Assistance: Limited assistance     Functional Limitations Info             SPECIAL CARE FACTORS FREQUENCY                       Contractures Contractures Info: Not present    Additional Factors Info  Code Status, Allergies Code Status Info: Full Allergies Info: NKA           Current Medications (09/01/2019):  This is the current hospital active medication list Current Facility-Administered Medications   Medication Dose Route Frequency Provider Last Rate Last Dose  . 0.9 %  sodium chloride infusion   Intravenous Continuous Lorretta Harp, MD 100 mL/hr at 09/01/19 0513    . acetaminophen (TYLENOL) tablet 650 mg  650 mg Oral Q6H PRN Lorretta Harp, MD       Or  . acetaminophen (TYLENOL) suppository 650 mg  650 mg Rectal Q6H PRN Lorretta Harp, MD      . albuterol (PROVENTIL) (2.5 MG/3ML) 0.083% nebulizer solution 2.5 mg  2.5 mg Inhalation Q4H PRN Lorretta Harp, MD      . ALPRAZolam Prudy Feeler) tablet 0.5 mg  0.5 mg Oral BID Lorretta Harp, MD   0.5 mg at 09/01/19 0807  . atorvastatin (LIPITOR) tablet 20 mg  20 mg Oral Daily Lorretta Harp, MD      . calcium carbonate (OS-CAL - dosed in mg of elemental calcium) tablet 500 mg of elemental calcium  1 tablet Oral Q breakfast Lorretta Harp, MD   500 mg of elemental calcium at 09/01/19 0809  . cefTRIAXone (ROCEPHIN) 1 g in sodium chloride 0.9 % 100 mL IVPB  1 g Intravenous Q24H Lorretta Harp, MD   Stopped at 08/31/19 1624  . enoxaparin (LOVENOX) injection 40 mg  40 mg Subcutaneous Q24H Lorretta Harp, MD   40 mg at 08/31/19 2300  . Melatonin TABS 2.5  mg  2.5 mg Oral QHS Ivor Costa, MD      . multivitamin-lutein (OCUVITE-LUTEIN) capsule 1 capsule  1 capsule Oral Daily Ivor Costa, MD      . ondansetron [facility]) tablet 4 mg  4 mg Oral Q6H PRN Ivor Costa, MD       Or  . ondansetron [facility]) injection 4 mg  4 mg Intravenous Q6H PRN Ivor Costa, MD      . pantoprazole (PROTONIX) EC tablet 40 mg  40 mg Oral Daily Ivor Costa, MD   40 mg at 09/01/19 0809  . PARoxetine (PAXIL) tablet 30 mg  30 mg Oral Daily Ivor Costa, MD   30 mg at 09/01/19 0809  . senna-docusate (Senokot-S) tablet 2 tablet  2 tablet Oral QHS PRN Ivor Costa, MD      . traMADol Veatrice Bourbon) tablet 50 mg  50 mg Oral BID PRN Ivor Costa, MD      . traZODone (DESYREL) tablet 50 mg  50 mg Oral QHS Ivor Costa, MD         Discharge Medications: TAKE these medications   ALPRAZolam 0.5 MG tablet Commonly known as: XANAX Take 0.5 mg by  mouth 2 (two) times daily.   amoxicillin-clavulanate 875-125 MG tablet Commonly known as: Augmentin Take 1 tablet by mouth 2 (two) times daily for 3 days.   atorvastatin 20 MG tablet Commonly known as: LIPITOR Take 20 mg by mouth daily.   calcium carbonate 1250 (500 Ca) MG tablet Commonly known as: OS-CAL - dosed in mg of elemental calcium Take 1 tablet by mouth.   Melatonin 3 MG Tabs Take 3 mg by mouth at bedtime.   multivitamin-lutein Caps capsule Take 1 capsule by mouth daily.   omeprazole 20 MG capsule Commonly known as: PRILOSEC Take 20 mg by mouth daily.   PARoxetine 30 MG tablet Commonly known as: PAXIL Take 30 mg by mouth daily.   senna-docusate 8.6-50 MG tablet Commonly known as: Senokot-S Take 2 tablets by mouth daily.   traMADol 50 MG tablet Commonly known as: ULTRAM Take 50 mg by mouth 2 (two) times daily as needed.   traZODone 50 MG tablet Commonly known as: DESYREL Take 50 mg by mouth at bedtime     Relevant Imaging Results:  Relevant Lab Results:   Additional Information    Shelbie Hutching, RN

## 2019-09-01 NOTE — Consult Note (Signed)
Shirley Villanueva is a 85 y.o. female  [id number]  Primary Cardiologist: Adrian Blackwater Reason for Consultation: Elevated troponin  HPI: This is a 85 year old white female with a past medical history of dementia aphasia who presented to the hospital with confusion and was found to be hypothermic and had mildly elevated troponin thus I was asked to evaluate the patient.  Talking to the patient apparently she is confused but there is no evidence of any distress and she says she is not short of breath or having chest pain.   Review of Systems: No chest pain or shortness of breath   Past Medical History:  Diagnosis Date  . Aphasia   . COPD (chronic obstructive pulmonary disease) (HCC)   . Lack of coordination   . Lewy body dementia (HCC)     Medications Prior to Admission  Medication Sig Dispense Refill  . ALPRAZolam (XANAX) 0.5 MG tablet Take 0.5 mg by mouth 2 (two) times daily.    Marland Kitchen atorvastatin (LIPITOR) 20 MG tablet Take 20 mg by mouth daily.    . calcium carbonate (OS-CAL - DOSED IN MG OF ELEMENTAL CALCIUM) 1250 (500 Ca) MG tablet Take 1 tablet by mouth.    . Melatonin 3 MG TABS Take 3 mg by mouth at bedtime.    . multivitamin-lutein (OCUVITE-LUTEIN) CAPS capsule Take 1 capsule by mouth daily.    Marland Kitchen omeprazole (PRILOSEC) 20 MG capsule Take 20 mg by mouth daily.    Marland Kitchen PARoxetine (PAXIL) 30 MG tablet Take 30 mg by mouth daily.    Marland Kitchen senna-docusate (SENOKOT-S) 8.6-50 MG tablet Take 2 tablets by mouth daily.    . traZODone (DESYREL) 50 MG tablet Take 50 mg by mouth at bedtime.    . traMADol (ULTRAM) 50 MG tablet Take 50 mg by mouth 2 (two) times daily as needed.       . ALPRAZolam  0.5 mg Oral BID  . atorvastatin  20 mg Oral Daily  . calcium carbonate  1 tablet Oral Q breakfast  . enoxaparin (LOVENOX) injection  40 mg Subcutaneous Q24H  . Melatonin  2.5 mg Oral QHS  . multivitamin-lutein  1 capsule Oral Daily  . pantoprazole  40 mg Oral Daily  . PARoxetine  30 mg Oral Daily  .  traZODone  50 mg Oral QHS    Infusions: . sodium chloride 100 mL/hr at 09/01/19 0513  . cefTRIAXone (ROCEPHIN)  IV Stopped (08/31/19 1624)    No Known Allergies  Social History   Socioeconomic History  . Marital status: Widowed    Spouse name: Not on file  . Number of children: Not on file  . Years of education: Not on file  . Highest education level: Not on file  Occupational History  . Not on file  Social Needs  . Financial resource strain: Not on file  . Food insecurity    Worry: Not on file    Inability: Not on file  . Transportation needs    Medical: Not on file    Non-medical: Not on file  Tobacco Use  . Smoking status: Never Smoker  . Smokeless tobacco: Never Used  Substance and Sexual Activity  . Alcohol use: Never    Frequency: Never  . Drug use: Never  . Sexual activity: Not Currently  Lifestyle  . Physical activity    Days per week: Not on file    Minutes per session: Not on file  . Stress: Not on file  Relationships  .  Social Musicianconnections    Talks on phone: Not on file    Gets together: Not on file    Attends religious service: Not on file    Active member of club or organization: Not on file    Attends meetings of clubs or organizations: Not on file    Relationship status: Not on file  . Intimate partner violence    Fear of current or ex partner: Not on file    Emotionally abused: Not on file    Physically abused: Not on file    Forced sexual activity: Not on file  Other Topics Concern  . Not on file  Social History Narrative  . Not on file    History reviewed. No pertinent family history.  PHYSICAL EXAM: Vitals:   08/31/19 1553 08/31/19 2032  BP: 137/67 (!) 144/53  Pulse: 78 96  Resp: 14 18  Temp:  98.8 F (37.1 C)  SpO2: 100% 95%     Intake/Output Summary (Last 24 hours) at 09/01/2019 0828 Last data filed at 08/31/2019 1630 Gross per 24 hour  Intake 500 ml  Output 700 ml  Net -200 ml    General:  Well appearing. No  respiratory difficulty HEENT: normal Neck: supple. no JVD. Carotids 2+ bilat; no bruits. No lymphadenopathy or thryomegaly appreciated. Cor: PMI nondisplaced. Regular rate & rhythm. No rubs, gallops or murmurs. Lungs: clear Abdomen: soft, nontender, nondistended. No hepatosplenomegaly. No bruits or masses. Good bowel sounds. Extremities: no cyanosis, clubbing, rash, edema Neuro: alert & oriented x 3, cranial nerves grossly intact. moves all 4 extremities w/o difficulty. Affect pleasant.  ECG: Atrial fibrillation with right bundle branch block and nonspecific ST-T changes  Results for orders placed or performed during the hospital encounter of 08/31/19 (from the past 24 hour(s))  Troponin I (High Sensitivity)     Status: Abnormal   Collection Time: 08/31/19  8:59 AM  Result Value Ref Range   Troponin I (High Sensitivity) 81 (H) <18 ng/L  Comprehensive metabolic panel     Status: Abnormal   Collection Time: 08/31/19  8:59 AM  Result Value Ref Range   Sodium 140 135 - 145 mmol/L   Potassium 4.5 3.5 - 5.1 mmol/L   Chloride 107 98 - 111 mmol/L   CO2 23 22 - 32 mmol/L   Glucose, Bld 107 (H) 70 - 99 mg/dL   BUN 26 (H) 8 - 23 mg/dL   Creatinine, Ser 1.610.97 0.44 - 1.00 mg/dL   Calcium 8.5 (L) 8.9 - 10.3 mg/dL   Total Protein 6.2 (L) 6.5 - 8.1 g/dL   Albumin 3.7 3.5 - 5.0 g/dL   AST 32 15 - 41 U/L   ALT 15 0 - 44 U/L   Alkaline Phosphatase 55 38 - 126 U/L   Total Bilirubin 0.8 0.3 - 1.2 mg/dL   GFR calc non Af Amer 52 (L) >60 mL/min   GFR calc Af Amer >60 >60 mL/min   Anion gap 10 5 - 15  CBC with Differential     Status: Abnormal   Collection Time: 08/31/19  8:59 AM  Result Value Ref Range   WBC 18.8 (H) 4.0 - 10.5 K/uL   RBC 4.29 3.87 - 5.11 MIL/uL   Hemoglobin 12.7 12.0 - 15.0 g/dL   HCT 09.638.2 04.536.0 - 40.946.0 %   MCV 89.0 80.0 - 100.0 fL   MCH 29.6 26.0 - 34.0 pg   MCHC 33.2 30.0 - 36.0 g/dL   RDW 81.112.5 91.411.5 - 78.215.5 %  Platelets 204 150 - 400 K/uL   nRBC 0.0 0.0 - 0.2 %    Neutrophils Relative % 87 %   Neutro Abs 16.5 (H) 1.7 - 7.7 K/uL   Lymphocytes Relative 5 %   Lymphs Abs 0.9 0.7 - 4.0 K/uL   Monocytes Relative 7 %   Monocytes Absolute 1.2 (H) 0.1 - 1.0 K/uL   Eosinophils Relative 0 %   Eosinophils Absolute 0.0 0.0 - 0.5 K/uL   Basophils Relative 0 %   Basophils Absolute 0.1 0.0 - 0.1 K/uL   Immature Granulocytes 1 %   Abs Immature Granulocytes 0.10 (H) 0.00 - 0.07 K/uL  CK     Status: Abnormal   Collection Time: 08/31/19  8:59 AM  Result Value Ref Range   Total CK 402 (H) 38 - 234 U/L  Lipase, blood     Status: None   Collection Time: 08/31/19  8:59 AM  Result Value Ref Range   Lipase 21 11 - 51 U/L  Urinalysis, Complete w Microscopic     Status: Abnormal   Collection Time: 08/31/19  8:59 AM  Result Value Ref Range   Color, Urine YELLOW (A) YELLOW   APPearance CLOUDY (A) CLEAR   Specific Gravity, Urine 1.010 1.005 - 1.030   pH 7.0 5.0 - 8.0   Glucose, UA NEGATIVE NEGATIVE mg/dL   Hgb urine dipstick NEGATIVE NEGATIVE   Bilirubin Urine NEGATIVE NEGATIVE   Ketones, ur NEGATIVE NEGATIVE mg/dL   Protein, ur NEGATIVE NEGATIVE mg/dL   Nitrite NEGATIVE NEGATIVE   Leukocytes,Ua MODERATE (A) NEGATIVE   RBC / HPF 0-5 0 - 5 RBC/hpf   WBC, UA 11-20 0 - 5 WBC/hpf   Bacteria, UA FEW (A) NONE SEEN   Squamous Epithelial / LPF NONE SEEN 0 - 5  SARS CORONAVIRUS 2 (TAT 6-24 HRS) Nasopharyngeal Nasopharyngeal Swab     Status: None   Collection Time: 08/31/19 10:07 AM   Specimen: Nasopharyngeal Swab  Result Value Ref Range   SARS Coronavirus 2 NEGATIVE NEGATIVE  Lactic acid, plasma     Status: Abnormal   Collection Time: 08/31/19 10:20 AM  Result Value Ref Range   Lactic Acid, Venous 2.4 (HH) 0.5 - 1.9 mmol/L  Procalcitonin     Status: None   Collection Time: 08/31/19 12:30 PM  Result Value Ref Range   Procalcitonin 3.24 ng/mL  Troponin I (High Sensitivity)     Status: Abnormal   Collection Time: 08/31/19 12:30 PM  Result Value Ref Range   Troponin  I (High Sensitivity) 161 (HH) <18 ng/L  Troponin I (High Sensitivity)     Status: Abnormal   Collection Time: 08/31/19  2:20 PM  Result Value Ref Range   Troponin I (High Sensitivity) 192 (HH) <18 ng/L  Lactic acid, plasma     Status: None   Collection Time: 08/31/19  5:03 PM  Result Value Ref Range   Lactic Acid, Venous 1.8 0.5 - 1.9 mmol/L  Troponin I (High Sensitivity)     Status: Abnormal   Collection Time: 08/31/19  5:07 PM  Result Value Ref Range   Troponin I (High Sensitivity) 186 (HH) <18 ng/L  AM - FLP     Status: None   Collection Time: 09/01/19  4:08 AM  Result Value Ref Range   Cholesterol 100 0 - 200 mg/dL   Triglycerides 65 <259 mg/dL   HDL 45 >56 mg/dL   Total CHOL/HDL Ratio 2.2 RATIO   VLDL 13 0 - 40 mg/dL  LDL Cholesterol 42 0 - 99 mg/dL  Basic metabolic panel     Status: Abnormal   Collection Time: 09/01/19  4:08 AM  Result Value Ref Range   Sodium 142 135 - 145 mmol/L   Potassium 3.9 3.5 - 5.1 mmol/L   Chloride 111 98 - 111 mmol/L   CO2 23 22 - 32 mmol/L   Glucose, Bld 80 70 - 99 mg/dL   BUN 23 8 - 23 mg/dL   Creatinine, Ser 1.02 (H) 0.44 - 1.00 mg/dL   Calcium 8.1 (L) 8.9 - 10.3 mg/dL   GFR calc non Af Amer 49 (L) >60 mL/min   GFR calc Af Amer 57 (L) >60 mL/min   Anion gap 8 5 - 15  CBC     Status: Abnormal   Collection Time: 09/01/19  4:08 AM  Result Value Ref Range   WBC 10.8 (H) 4.0 - 10.5 K/uL   RBC 3.54 (L) 3.87 - 5.11 MIL/uL   Hemoglobin 10.3 (L) 12.0 - 15.0 g/dL   HCT 31.1 (L) 36.0 - 46.0 %   MCV 87.9 80.0 - 100.0 fL   MCH 29.1 26.0 - 34.0 pg   MCHC 33.1 30.0 - 36.0 g/dL   RDW 12.6 11.5 - 15.5 %   Platelets 171 150 - 400 K/uL   nRBC 0.0 0.0 - 0.2 %   Dg Knee 1-2 Views Left  Result Date: 08/31/2019 CLINICAL DATA:  [place] this morning. Left knee pain. EXAM: LEFT KNEE - 1-2 VIEW COMPARISON:  None. FINDINGS: Advanced lateral compartment degenerative changes are noted. No acute fracture is identified. No joint effusion. Intramedullary rod and  distal interlocking screw noted in the distal femur. Soft tissue thickening over the region the patellar tendon likely focal contusion or prepatellar bursitis. IMPRESSION: 1. Advanced lateral compartment degenerative changes. 2. No acute fracture or joint effusion. 3. Soft tissue thickening over the region of the patellar tendon likely contusion or prepatellar bursitis. Electronically Signed   By: Marijo Sanes M.D.   On: 08/31/2019 13:14   Dg Knee 1-2 Views Right  Result Date: 08/31/2019 CLINICAL DATA:  [place]. Knee pain. EXAM: RIGHT KNEE - 1-2 VIEW COMPARISON:  None. FINDINGS: Mild tricompartmental degenerative changes. No definite acute fracture or joint effusion. IMPRESSION: Tricompartmental degenerative changes but no acute fracture or joint effusion. Electronically Signed   By: Marijo Sanes M.D.   On: 08/31/2019 13:15   Ct Head Wo Contrast  Result Date: 08/31/2019 CLINICAL DATA:  Lewy body dementia, hypothermia EXAM: CT HEAD WITHOUT CONTRAST TECHNIQUE: Contiguous axial images were obtained from the base of the skull through the vertex without intravenous contrast. COMPARISON:  07/20/2018 FINDINGS: Brain: Diffuse parenchymal atrophy. Patchy areas of hypoattenuation in deep and periventricular white matter bilaterally. Negative for acute intracranial hemorrhage, mass lesion, acute infarction, midline shift, or mass-effect. Acute infarct may be inapparent on noncontrast CT. Ventricles and sulci symmetric. Vascular: Atherosclerotic and physiologic intracranial calcifications. Skull: Normal. Negative for fracture or focal lesion. Sinuses/Orbits: No acute finding. Other: None IMPRESSION: 1. Brain atrophy and chronic microvascular ischemic changes. 2. No acute intracranial abnormality by noncontrast CT. Electronically Signed   By: Lucrezia Europe M.D.   On: 08/31/2019 09:19   Dg Chest Portable 1 View  Result Date: 08/31/2019 CLINICAL DATA:  Hypothermia. Rectal temperature of 88 degrees. History of Lue E body  dementia. EXAM: PORTABLE CHEST 1 VIEW COMPARISON:  05/26/2013 FINDINGS: Normal heart size. There is blunting of the left costophrenic angle compatible with small effusion. Atelectasis noted in  the left lung base. No airspace consolidation identified. IMPRESSION: 1. Small left pleural effusion. 2. Left base atelectasis. Electronically Signed   By: Signa Kell M.D.   On: 08/31/2019 09:15     ASSESSMENT AND PLAN: Atrial fibrillation with mildly elevated troponin probably due to demand ischemia with no evidence of any chest pain or any signs and symptoms of acute MI.  Consider anticoagulation because patient has atrial fibrillation but the ventricular rate is controlled.  KHAN,SHAUKAT A

## 2019-09-01 NOTE — TOC Transition Note (Signed)
Transition of Care [facility]) - CM/SW Discharge Note   Patient Details  Name: Shirley Villanueva MRN: [id number] Date of Birth: 1927-11-26  [facility] CM/SW Contact:  Shelbie Hutching, RN Phone Number: 09/01/2019, 12:21 PM   Clinical Narrative:     Discharging to [facility].  Tammy McKinny activities coordinator at [employer] will be picking patient up at around 3pm this afternoon.   Final next level of care: Assisted Living Barriers to Discharge: No Barriers Identified   Patient Goals and CMS Choice        Discharge Placement                       Discharge Plan and Services   Discharge Planning Services: CM Consult                                 Social Determinants of Health (SDOH) Interventions     Readmission Risk Interventions No flowsheet data found.

## 2019-09-01 NOTE — Discharge Summary (Signed)
Physician Discharge Summary  ICESS BERTONI HFW:[id number] DOB: 09/07/1928 DOA: 08/31/2019  PCP: Bonnita Nasuti, MD  Admit date: 08/31/2019 Discharge date: 09/01/2019  Admitted From: ALF, [name] Disposition:  ALF, [name]  Recommendations for Outpatient Follow-up:  1. Follow up with PCP in 1-2 weeks 2. Please obtain BMP/CBC in one week  Home Health: No  Equipment/Devices: None   Discharge Condition: Stable  CODE STATUS: Full  Diet recommendation: Heart Healthy  Brief/Interim Summary:  85 y.o. female with medical history significant of hypertension, COPD, GERD, depression with anxiety, Lewy body dementia, who presents with hypothermia due to cold exposure.  She had been found outside at her ALF for an unknown amount of time.  Patient initially with temp of 89.35F, improved in the ED prior to admission.  Patient had elevated troponin, cardiology consulted.  Labs revealed WBC 18.8k, lactate elevated 2.4, electrolytes and renal function unremarkable.  HR mildly elevated but resolved.  ECG showed RBBB and LAFB.  Cardiology attributed troponin elevation to demand ischemia, thought unlikely to be ACS.  Recommended consideration of anticoagulation for paroxysmal a-fib, however patient appears to be high fall risk, so will defer this for her PCP's consideration.  No signs of any chest pain, SOB.  CT head negative.  Bilateral knee xrays without acute fractures, only chronic degenerative changes.  Chest xray showed a small left pleural effusion with atelectasis but negative for infiltrates to suggest any PNA.  Urinalysis abnormal, consistent with UTI, started on Rocephin.  Patient clinically improved and hemodynamically stable for discharge back to [facility] today.  She is to complete 3 more days antibiotics for UTI.   Discharge Diagnoses: Principal Problem:   Hypothermia Active Problems:   Lewy body dementia (HCC)   COPD (chronic obstructive pulmonary disease) (HCC)   Cold exposure    Elevated troponin   HLD (hyperlipidemia)   Sepsis (HCC)   UTI (urinary tract infection)    Discharge Instructions   Discharge Instructions    Call MD for:  temperature >100.4   Complete by: As directed    Diet - low sodium heart healthy   Complete by: As directed    Discharge instructions   Complete by: As directed    Take Augmentin twice daily for 3 more days.   Increase activity slowly   Complete by: As directed      Allergies as of 09/01/2019   No Known Allergies     Medication List    TAKE these medications   ALPRAZolam 0.5 MG tablet Commonly known as: XANAX Take 0.5 mg by mouth 2 (two) times daily.   amoxicillin-clavulanate 875-125 MG tablet Commonly known as: Augmentin Take 1 tablet by mouth 2 (two) times daily for 3 days.   atorvastatin 20 MG tablet Commonly known as: LIPITOR Take 20 mg by mouth daily.   calcium carbonate 1250 (500 Ca) MG tablet Commonly known as: OS-CAL - dosed in mg of elemental calcium Take 1 tablet by mouth.   Melatonin 3 MG Tabs Take 3 mg by mouth at bedtime.   multivitamin-lutein Caps capsule Take 1 capsule by mouth daily.   omeprazole 20 MG capsule Commonly known as: PRILOSEC Take 20 mg by mouth daily.   PARoxetine 30 MG tablet Commonly known as: PAXIL Take 30 mg by mouth daily.   senna-docusate 8.6-50 MG tablet Commonly known as: Senokot-S Take 2 tablets by mouth daily.   traMADol 50 MG tablet Commonly known as: ULTRAM Take 50 mg by mouth 2 (two) times daily as needed.  traZODone 50 MG tablet Commonly known as: DESYREL Take 50 mg by mouth at bedtime.       No Known Allergies  Consultations:  Cardiology    Procedures/Studies: Dg Knee 1-2 Views Left  Result Date: 08/31/2019 CLINICAL DATA:  Larey Seat this morning. Left knee pain. EXAM: LEFT KNEE - 1-2 VIEW COMPARISON:  None. FINDINGS: Advanced lateral compartment degenerative changes are noted. No acute fracture is identified. No joint effusion. Intramedullary  rod and distal interlocking screw noted in the distal femur. Soft tissue thickening over the region the patellar tendon likely focal contusion or prepatellar bursitis. IMPRESSION: 1. Advanced lateral compartment degenerative changes. 2. No acute fracture or joint effusion. 3. Soft tissue thickening over the region of the patellar tendon likely contusion or prepatellar bursitis. Electronically Signed   By: Rudie Meyer M.D.   On: 08/31/2019 13:14   Dg Knee 1-2 Views Right  Result Date: 08/31/2019 CLINICAL DATA:  Larey Seat. Knee pain. EXAM: RIGHT KNEE - 1-2 VIEW COMPARISON:  None. FINDINGS: Mild tricompartmental degenerative changes. No definite acute fracture or joint effusion. IMPRESSION: Tricompartmental degenerative changes but no acute fracture or joint effusion. Electronically Signed   By: Rudie Meyer M.D.   On: 08/31/2019 13:15   Ct Head Wo Contrast  Result Date: 08/31/2019 CLINICAL DATA:  Lewy body dementia, hypothermia EXAM: CT HEAD WITHOUT CONTRAST TECHNIQUE: Contiguous axial images were obtained from the base of the skull through the vertex without intravenous contrast. COMPARISON:  07/20/2018 FINDINGS: Brain: Diffuse parenchymal atrophy. Patchy areas of hypoattenuation in deep and periventricular white matter bilaterally. Negative for acute intracranial hemorrhage, mass lesion, acute infarction, midline shift, or mass-effect. Acute infarct may be inapparent on noncontrast CT. Ventricles and sulci symmetric. Vascular: Atherosclerotic and physiologic intracranial calcifications. Skull: Normal. Negative for fracture or focal lesion. Sinuses/Orbits: No acute finding. Other: None IMPRESSION: 1. Brain atrophy and chronic microvascular ischemic changes. 2. No acute intracranial abnormality by noncontrast CT. Electronically Signed   By: Corlis Leak M.D.   On: 08/31/2019 09:19   Dg Chest Portable 1 View  Result Date: 08/31/2019 CLINICAL DATA:  Hypothermia. Rectal temperature of 88 degrees. History of Lue E  body dementia. EXAM: PORTABLE CHEST 1 VIEW COMPARISON:  05/26/2013 FINDINGS: Normal heart size. There is blunting of the left costophrenic angle compatible with small effusion. Atelectasis noted in the left lung base. No airspace consolidation identified. IMPRESSION: 1. Small left pleural effusion. 2. Left base atelectasis. Electronically Signed   By: Signa Kell M.D.   On: 08/31/2019 09:15     Subjective: Patient seen awake in bed.  No acute events reported, however RN did report patient attempted to get out of bed, pulling at IV line.  Patient denies pain or discomfort.  She is otherwise unable to tell me any symptoms.  Baseline cognition unclear to me.  Vitals stable and temp normal.   Discharge Exam: Vitals:   09/01/19 0839 09/01/19 1100  BP:    Pulse: 74   Resp:    Temp:    SpO2: 98% 97%   Vitals:   08/31/19 1700 08/31/19 2032 09/01/19 0839 09/01/19 1100  BP:  (!) 144/53    Pulse:  96 74   Resp:  18    Temp:  98.8 F (37.1 C)    TempSrc:  Oral    SpO2:  95% 98% 97%  Weight: 57.6 kg       General: Pt is alert, awake, not in acute distress Cardiovascular: RRR, S1/S2 +, no rubs, no gallops Respiratory:  CTA bilaterally, no wheezing, no rhonchi Abdominal: Soft, NT, ND, bowel sounds + Extremities: no edema, no cyanosis    The results of significant diagnostics from this hospitalization (including imaging, microbiology, ancillary and laboratory) are listed below for reference.     Microbiology: Recent Results (from the past 240 hour(s))  CULTURE, BLOOD (ROUTINE X 2) w Reflex to ID Panel     Status: None (Preliminary result)   Collection Time: 08/31/19  6:23 AM   Specimen: BLOOD  Result Value Ref Range Status   Specimen Description BLOOD LH   Final   Special Requests   Final    BOTTLES DRAWN AEROBIC AND ANAEROBIC Blood Culture adequate volume   Culture   Final    NO GROWTH < 24 HOURS Performed at [facility], [address] [place], [place] [address]     Report Status PENDING  Incomplete  CULTURE, BLOOD (ROUTINE X 2) w Reflex to ID Panel     Status: None (Preliminary result)   Collection Time: 08/31/19  6:23 AM   Specimen: BLOOD  Result Value Ref Range Status   Specimen Description BLOOD RH  Final   Special Requests   Final    BOTTLES DRAWN AEROBIC AND ANAEROBIC Blood Culture adequate volume   Culture   Final    NO GROWTH < 24 HOURS Performed at [facility], [address] [place], [place] [address]    Report Status PENDING  Incomplete  SARS CORONAVIRUS 2 (TAT 6-24 HRS) Nasopharyngeal Nasopharyngeal Swab     Status: None   Collection Time: 08/31/19 10:07 AM   Specimen: Nasopharyngeal Swab  Result Value Ref Range Status   SARS Coronavirus 2 NEGATIVE NEGATIVE Final    Comment: (NOTE) SARS-CoV-2 target nucleic acids are NOT DETECTED. The SARS-CoV-2 RNA is generally detectable in upper and lower respiratory specimens during the acute phase of infection. Negative results do not preclude SARS-CoV-2 infection, do not rule out co-infections with other pathogens, and should not be used as the sole basis for treatment or other patient management decisions. Negative results must be combined with clinical observations, patient history, and epidemiological information. The expected result is Negative. Fact Sheet for Patients: HairSlick.nohttps://www.fda.gov/media/138098/download Fact Sheet for Healthcare Providers: quierodirigir.comhttps://www.fda.gov/media/138095/download This test is not yet approved or cleared by the [place] FDA and  has been authorized for detection and/or diagnosis of SARS-CoV-2 by FDA under an Emergency Use Authorization (EUA). This EUA will remain  in effect (meaning this test can be used) for the duration of the COVID-19 declaration under Section 56 4(b)(1) of the Act, 21 U.S.C. section 360bbb-3(b)(1), unless the authorization is terminated or revoked sooner. Performed at [facility], [address] [place],  [place] [address]      Labs: BNP (last 3 results) No results for input(s): BNP in the last 8760 hours. Basic Metabolic Panel: Recent Labs  Lab 08/31/19 0623 08/31/19 0859 09/01/19 0408  NA 142 140 142  K 4.4 4.5 3.9  CL 104 107 111  CO2 22 23 23   GLUCOSE 160* 107* 80  BUN 26* 26* 23  CREATININE 1.18* 0.97 1.02*  CALCIUM 9.6 8.5* 8.1*  MG 2.1  --   --    Liver Function Tests: Recent Labs  Lab 08/31/19 0623 08/31/19 0859  AST 27 32  ALT 14 15  ALKPHOS 65 55  BILITOT 1.1 0.8  PROT 7.2 6.2*  ALBUMIN 4.2 3.7   Recent Labs  Lab 08/31/19 0859  LIPASE 21   No  results for input(s): AMMONIA in the last 168 hours. CBC: Recent Labs  Lab 08/31/19 0623 08/31/19 0859 09/01/19 0408  WBC 24.1* 18.8* 10.8*  NEUTROABS 19.5* 16.5*  --   HGB 14.0 12.7 10.3*  HCT 43.6 38.2 31.1*  MCV 90.5 89.0 87.9  PLT 252 204 171   Cardiac Enzymes: Recent Labs  Lab 08/31/19 0623 08/31/19 0859  CKTOTAL 327* 402*   BNP: Invalid input(s): POCBNP CBG: No results for input(s): GLUCAP in the last 168 hours. D-Dimer No results for input(s): DDIMER in the last 72 hours. Hgb A1c Recent Labs    09/01/19 0408  HGBA1C 5.7*   Lipid Profile Recent Labs    09/01/19 0408  CHOL 100  HDL 45  LDLCALC 42  TRIG 65  CHOLHDL 2.2   Thyroid function studies No results for input(s): TSH, T4TOTAL, T3FREE, THYROIDAB in the last 72 hours.  Invalid input(s): FREET3 Anemia work up No results for input(s): VITAMINB12, FOLATE, FERRITIN, TIBC, IRON, RETICCTPCT in the last 72 hours. Urinalysis    Component Value Date/Time   COLORURINE YELLOW (A) 08/31/2019 0859   APPEARANCEUR CLOUDY (A) 08/31/2019 0859   APPEARANCEUR Clear 05/26/2013 1604   LABSPEC 1.010 08/31/2019 0859   LABSPEC 1.017 05/26/2013 1604   PHURINE 7.0 08/31/2019 0859   GLUCOSEU NEGATIVE 08/31/2019 0859   GLUCOSEU Negative 05/26/2013 1604   HGBUR NEGATIVE 08/31/2019 0859   BILIRUBINUR NEGATIVE 08/31/2019 0859   BILIRUBINUR Negative  05/26/2013 1604   KETONESUR NEGATIVE 08/31/2019 0859   PROTEINUR NEGATIVE 08/31/2019 0859   NITRITE NEGATIVE 08/31/2019 0859   LEUKOCYTESUR MODERATE (A) 08/31/2019 0859   LEUKOCYTESUR Negative 05/26/2013 1604   Sepsis Labs Invalid input(s): PROCALCITONIN,  WBC,  LACTICIDVEN Microbiology Recent Results (from the past 240 hour(s))  CULTURE, BLOOD (ROUTINE X 2) w Reflex to ID Panel     Status: None (Preliminary result)   Collection Time: 08/31/19  6:23 AM   Specimen: BLOOD  Result Value Ref Range Status   Specimen Description BLOOD LH   Final   Special Requests   Final    BOTTLES DRAWN AEROBIC AND ANAEROBIC Blood Culture adequate volume   Culture   Final    NO GROWTH < 24 HOURS Performed at [facility], [address] [place], [place] [address]    Report Status PENDING  Incomplete  CULTURE, BLOOD (ROUTINE X 2) w Reflex to ID Panel     Status: None (Preliminary result)   Collection Time: 08/31/19  6:23 AM   Specimen: BLOOD  Result Value Ref Range Status   Specimen Description BLOOD RH  Final   Special Requests   Final    BOTTLES DRAWN AEROBIC AND ANAEROBIC Blood Culture adequate volume   Culture   Final    NO GROWTH < 24 HOURS Performed at [facility], [address] [place], [place] [address]    Report Status PENDING  Incomplete  SARS CORONAVIRUS 2 (TAT 6-24 HRS) Nasopharyngeal Nasopharyngeal Swab     Status: None   Collection Time: 08/31/19 10:07 AM   Specimen: Nasopharyngeal Swab  Result Value Ref Range Status   SARS Coronavirus 2 NEGATIVE NEGATIVE Final    Comment: (NOTE) SARS-CoV-2 target nucleic acids are NOT DETECTED. The SARS-CoV-2 RNA is generally detectable in upper and lower respiratory specimens during the acute phase of infection. Negative results do not preclude SARS-CoV-2 infection, do not rule out co-infections with other pathogens, and should not be used as the sole basis for treatment or other patient management decisions.  Negative  results must be combined with clinical observations, patient history, and epidemiological information. The expected result is Negative. Fact Sheet for Patients: HairSlick.no Fact Sheet for Healthcare Providers: quierodirigir.com This test is not yet approved or cleared by the [place] FDA and  has been authorized for detection and/or diagnosis of SARS-CoV-2 by FDA under an Emergency Use Authorization (EUA). This EUA will remain  in effect (meaning this test can be used) for the duration of the COVID-19 declaration under Section 56 4(b)(1) of the Act, 21 U.S.C. section 360bbb-3(b)(1), unless the authorization is terminated or revoked sooner. Performed at [facility], [address] [place], [place] [address]      Time coordinating discharge: Over 30 minutes  SIGNED:   Pennie Banter, DO [facility] 09/01/2019, 11:44 AM Pager [phone]  If 7PM-7AM, please contact night-coverage [email or web address] Password [email or web address]

## 2019-09-04 LAB — URINE CULTURE: Culture: >=100000 — AB

## 2019-09-05 LAB — CULTURE, BLOOD (ROUTINE X 2)
Culture: NO GROWTH
Culture: NO GROWTH
Special Requests: ADEQUATE
Special Requests: ADEQUATE

## 2020-10-22 IMAGING — DX DG KNEE 1-2V*R*
2 series · 2 of 2 positions shown · non-contrast
Comparison: None.

CLINICAL DATA: Fell. Knee pain.

EXAM:
RIGHT KNEE - 1-2 VIEW

[knee ap]
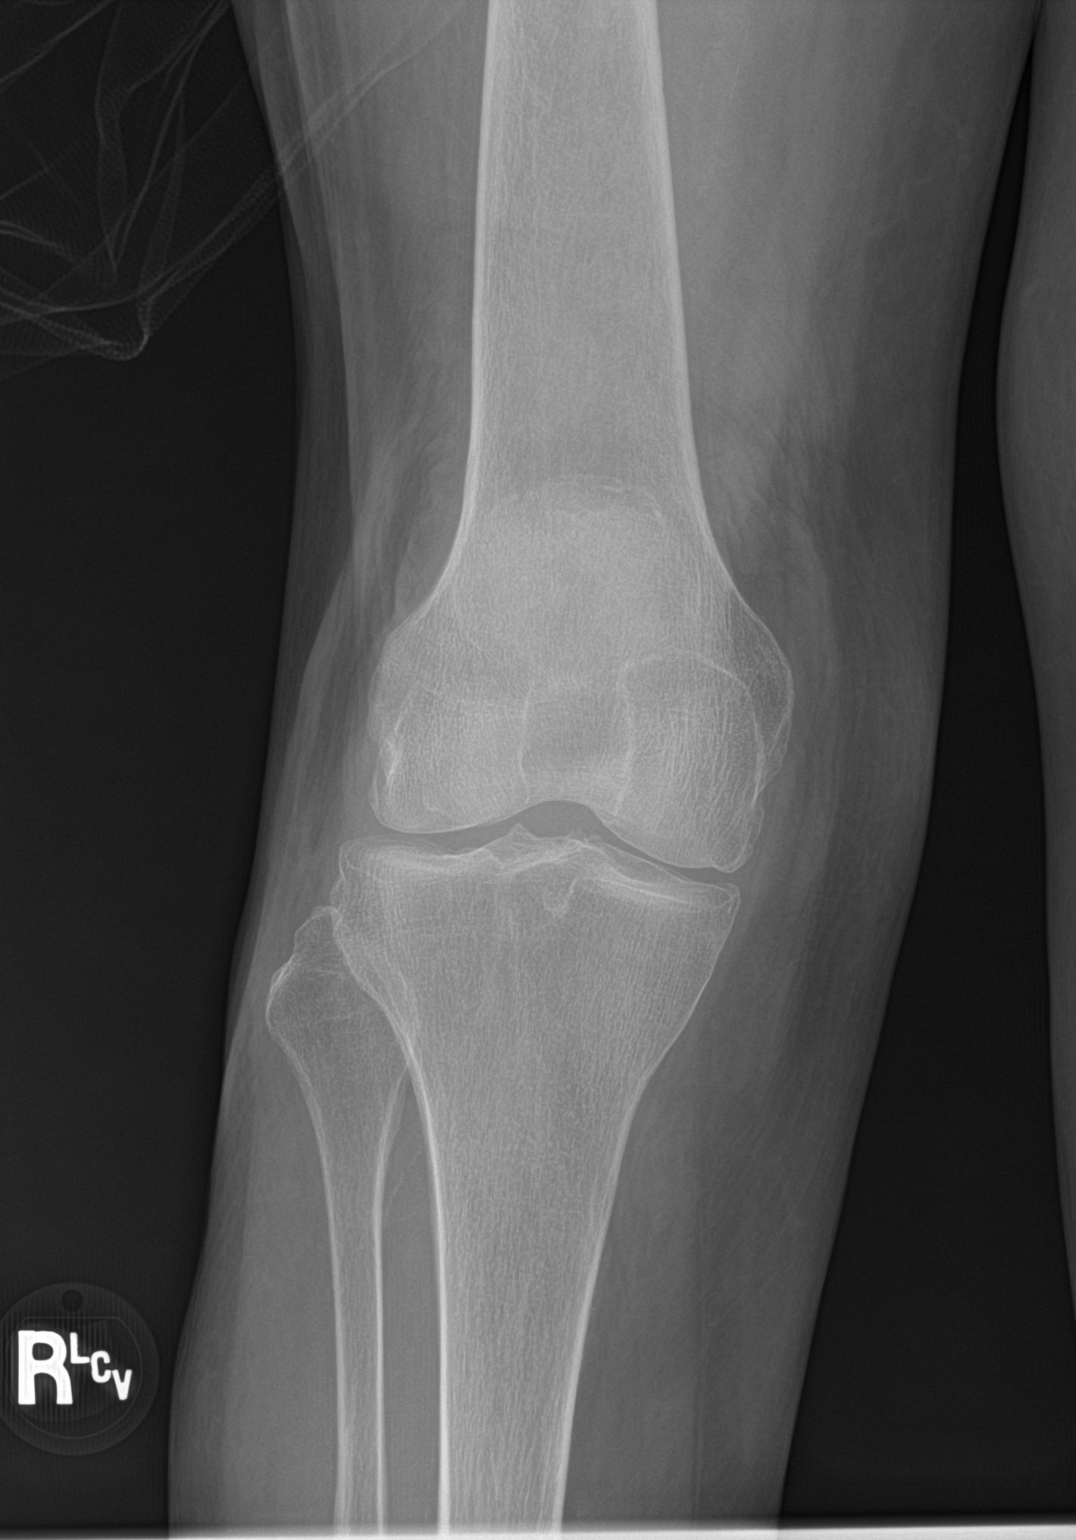

[knee lat]
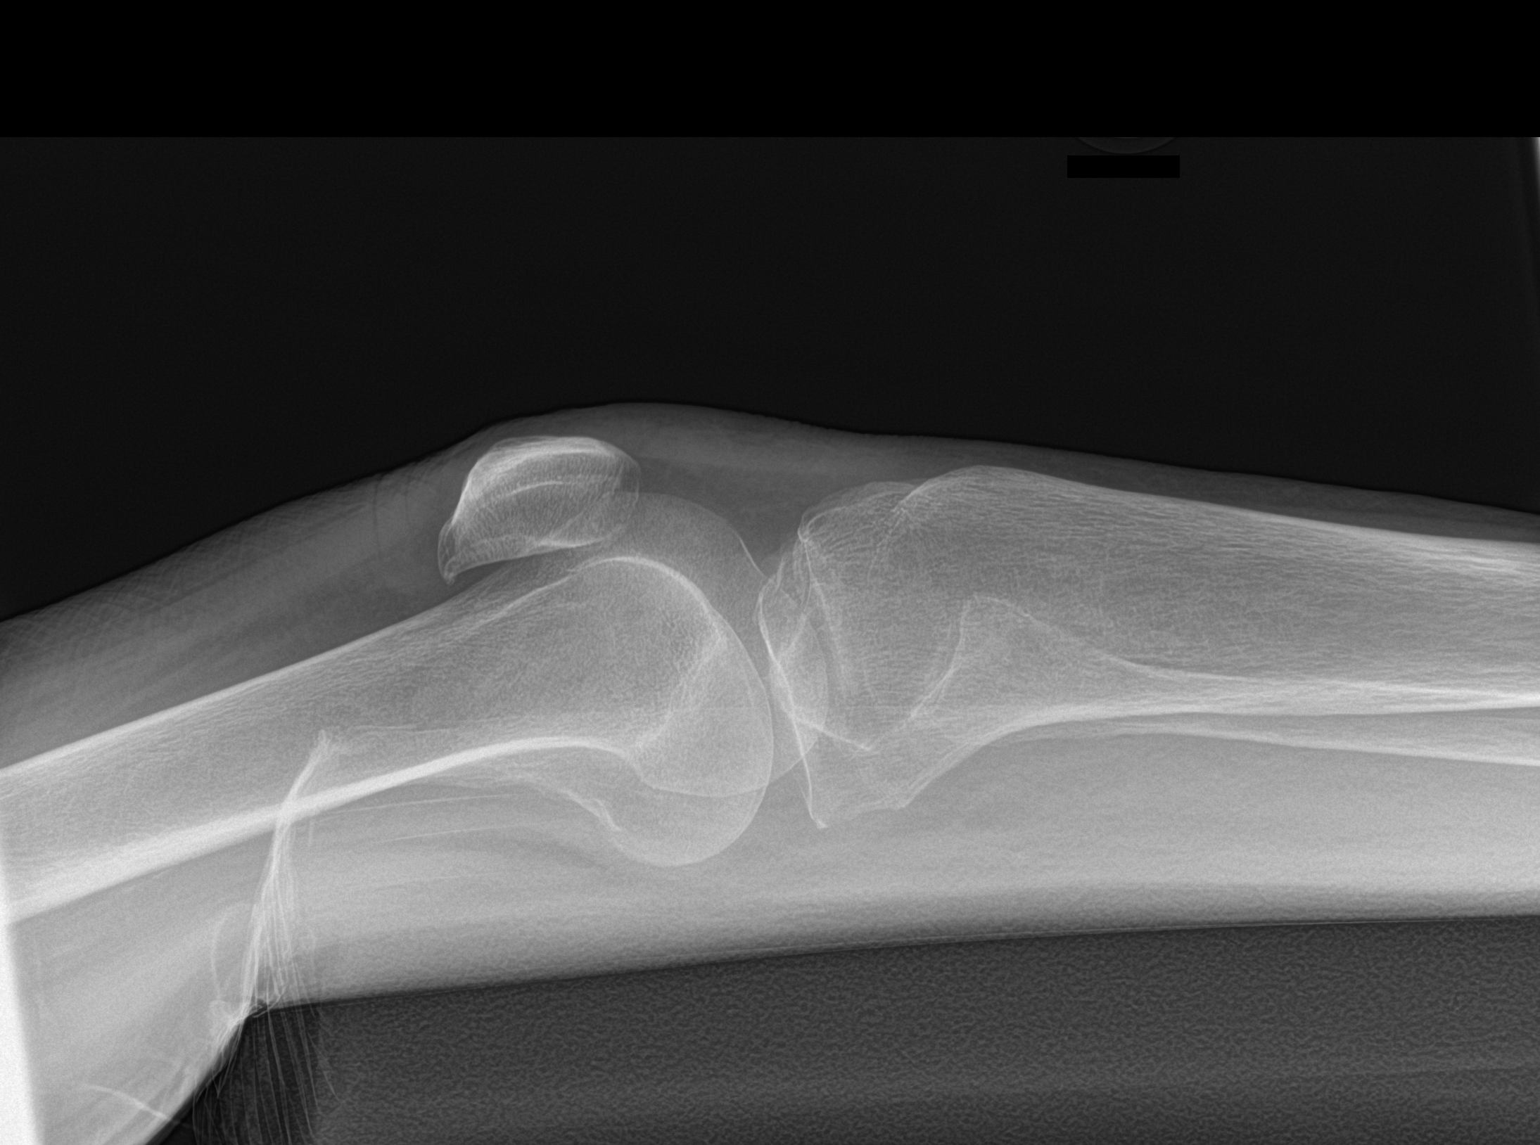

[2 of 2 positions shown; findings below may reference images not displayed]

FINDINGS: Mild tricompartmental degenerative changes. No definite acute
fracture or joint effusion.
IMPRESSION: Tricompartmental degenerative changes but no acute fracture or joint
effusion.

## 2020-10-22 IMAGING — DX DG KNEE 1-2V*L*
2 series · 2 of 2 positions shown · non-contrast
Comparison: None.

CLINICAL DATA: Fell this morning. Left knee pain.

EXAM:
LEFT KNEE - 1-2 VIEW

[knee ap]
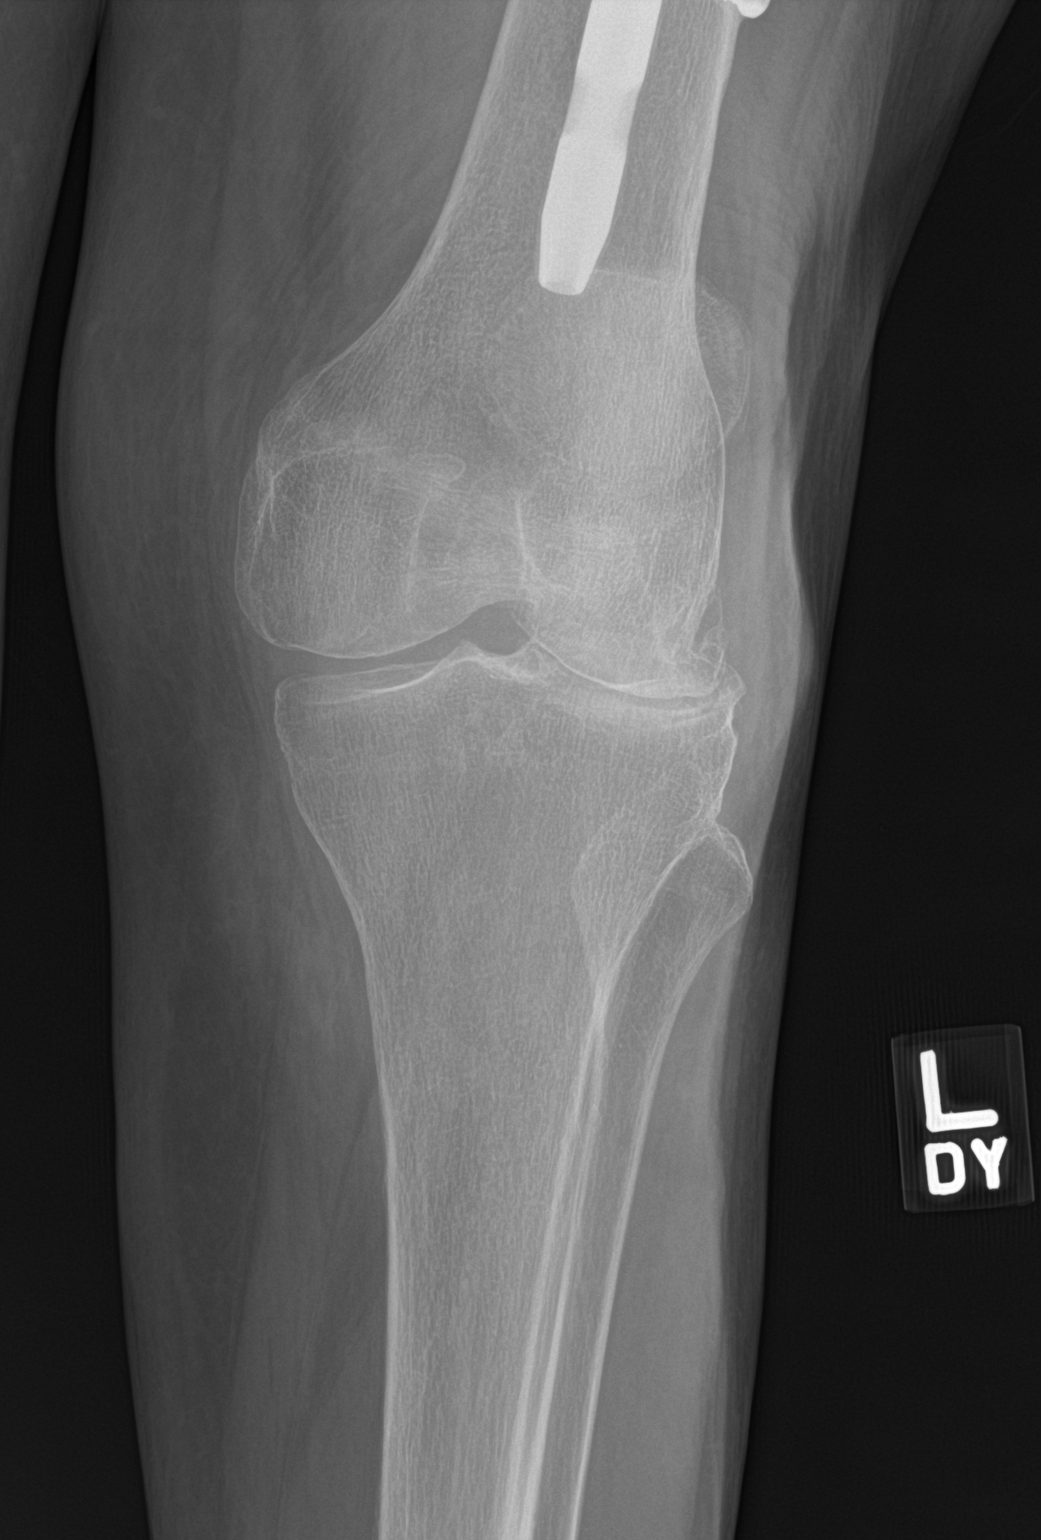

[knee lat]
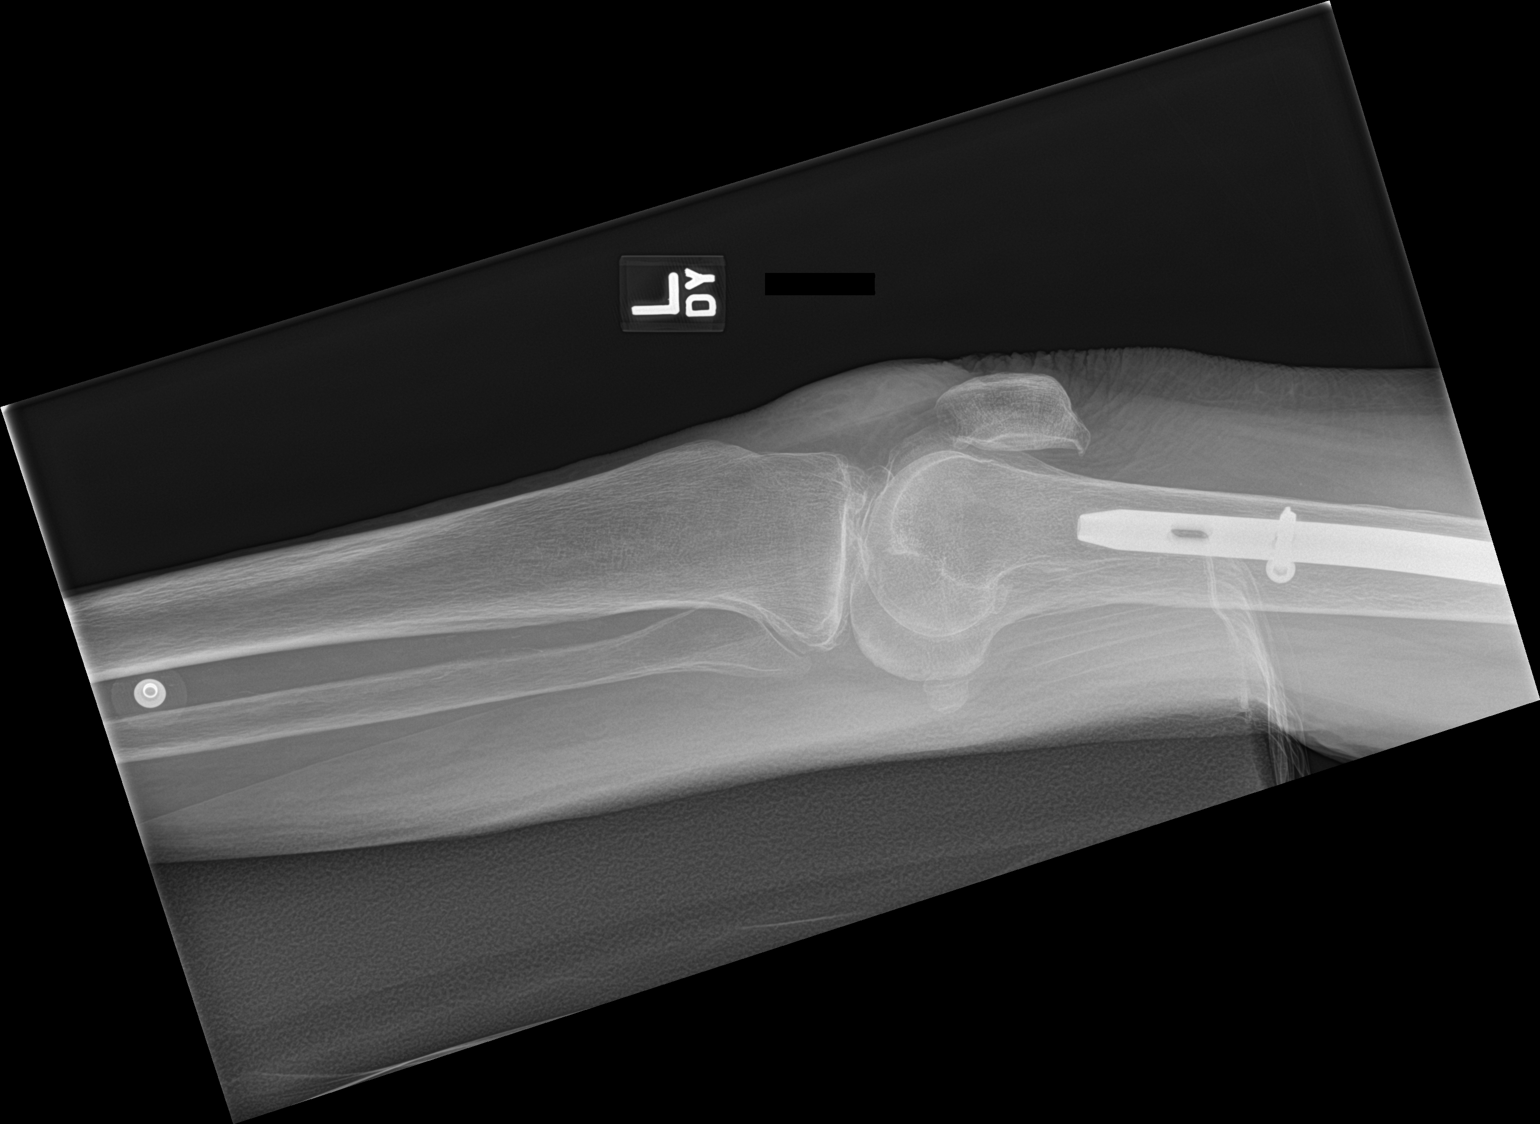

[2 of 2 positions shown; findings below may reference images not displayed]

FINDINGS: Advanced lateral compartment degenerative changes are noted.

No acute fracture is identified. No joint effusion. Intramedullary
rod and distal interlocking screw noted in the distal femur. Soft
tissue thickening over the region the patellar tendon likely focal
contusion or prepatellar bursitis.
IMPRESSION: 1. Advanced lateral compartment degenerative changes.
2. No acute fracture or joint effusion.
3. Soft tissue thickening over the region of the patellar tendon
likely contusion or prepatellar bursitis.

## 2020-12-31 ENCOUNTER — Other Ambulatory Visit: Payer: Self-pay

## 2020-12-31 ENCOUNTER — Emergency Department: Payer: Medicare Other

## 2020-12-31 ENCOUNTER — Observation Stay: Payer: Medicare Other

## 2020-12-31 ENCOUNTER — Encounter: Payer: Self-pay | Admitting: Emergency Medicine

## 2020-12-31 ENCOUNTER — Observation Stay
Admission: EM | Admit: 2020-12-31 | Discharge: 2021-01-01 | Disposition: A | Discharge status: Home / Self Care | Payer: Medicare Other | Attending: Internal Medicine | Admitting: Internal Medicine

## 2020-12-31 ENCOUNTER — Observation Stay: Admit: 2020-12-31 | Payer: Medicare Other

## 2020-12-31 DIAGNOSIS — F418 Other specified anxiety disorders: Secondary | ICD-10-CM | POA: Diagnosis present

## 2020-12-31 DIAGNOSIS — Z20822 Contact with and (suspected) exposure to covid-19: Secondary | ICD-10-CM | POA: Insufficient documentation

## 2020-12-31 DIAGNOSIS — Z79899 Other long term (current) drug therapy: Secondary | ICD-10-CM | POA: Diagnosis not present

## 2020-12-31 DIAGNOSIS — I129 Hypertensive chronic kidney disease with stage 1 through stage 4 chronic kidney disease, or unspecified chronic kidney disease: Secondary | ICD-10-CM | POA: Insufficient documentation

## 2020-12-31 DIAGNOSIS — F0390 Unspecified dementia without behavioral disturbance: Secondary | ICD-10-CM | POA: Diagnosis not present

## 2020-12-31 DIAGNOSIS — R471 Dysarthria and anarthria: Secondary | ICD-10-CM | POA: Insufficient documentation

## 2020-12-31 DIAGNOSIS — G459 Transient cerebral ischemic attack, unspecified: Secondary | ICD-10-CM | POA: Diagnosis not present

## 2020-12-31 DIAGNOSIS — F0280 Dementia in other diseases classified elsewhere without behavioral disturbance: Secondary | ICD-10-CM | POA: Diagnosis present

## 2020-12-31 DIAGNOSIS — N1831 Chronic kidney disease, stage 3a: Secondary | ICD-10-CM | POA: Diagnosis not present

## 2020-12-31 DIAGNOSIS — E785 Hyperlipidemia, unspecified: Secondary | ICD-10-CM | POA: Diagnosis not present

## 2020-12-31 DIAGNOSIS — G3183 Dementia with Lewy bodies: Secondary | ICD-10-CM | POA: Diagnosis present

## 2020-12-31 DIAGNOSIS — J449 Chronic obstructive pulmonary disease, unspecified: Secondary | ICD-10-CM | POA: Diagnosis present

## 2020-12-31 DIAGNOSIS — R4182 Altered mental status, unspecified: Secondary | ICD-10-CM | POA: Insufficient documentation

## 2020-12-31 DIAGNOSIS — N183 Chronic kidney disease, stage 3 unspecified: Secondary | ICD-10-CM | POA: Diagnosis present

## 2020-12-31 DIAGNOSIS — R400 Somnolence: Secondary | ICD-10-CM | POA: Diagnosis present

## 2020-12-31 LAB — DIFFERENTIAL
Abs Immature Granulocytes: 0.02 10*3/uL (ref 0.00–0.07)
Basophils Absolute: 0.1 10*3/uL (ref 0.0–0.1)
Basophils Relative: 1 %
Eosinophils Absolute: 0.2 10*3/uL (ref 0.0–0.5)
Eosinophils Relative: 3 %
Immature Granulocytes: 0 %
Lymphocytes Relative: 21 %
Lymphs Abs: 1.3 10*3/uL (ref 0.7–4.0)
Monocytes Absolute: 0.4 10*3/uL (ref 0.1–1.0)
Monocytes Relative: 6 %
Neutro Abs: 4.2 10*3/uL (ref 1.7–7.7)
Neutrophils Relative %: 69 %

## 2020-12-31 LAB — COMPREHENSIVE METABOLIC PANEL
ALT: 15 U/L (ref 0–44)
AST: 18 U/L (ref 15–41)
Albumin: 3.3 g/dL — ABNORMAL LOW (ref 3.5–5.0)
Alkaline Phosphatase: 46 U/L (ref 38–126)
Anion gap: 6 (ref 5–15)
BUN: 37 mg/dL — ABNORMAL HIGH (ref 8–23)
CO2: 28 mmol/L (ref 22–32)
Calcium: 8.8 mg/dL — ABNORMAL LOW (ref 8.9–10.3)
Chloride: 106 mmol/L (ref 98–111)
Creatinine, Ser: 1.01 mg/dL — ABNORMAL HIGH (ref 0.44–1.00)
GFR, Estimated: 53 mL/min — ABNORMAL LOW (ref >60)
Glucose, Bld: 155 mg/dL — ABNORMAL HIGH (ref 70–99)
Potassium: 4.3 mmol/L (ref 3.5–5.1)
Sodium: 140 mmol/L (ref 135–145)
Total Bilirubin: 0.7 mg/dL (ref 0.3–1.2)
Total Protein: 5.7 g/dL — ABNORMAL LOW (ref 6.5–8.1)

## 2020-12-31 LAB — PROTIME-INR
INR: 1 (ref 0.8–1.2)
Prothrombin Time: 12.8 seconds (ref 11.4–15.2)

## 2020-12-31 LAB — CBC
HCT: 37.1 % (ref 36.0–46.0)
Hemoglobin: 11.9 g/dL — ABNORMAL LOW (ref 12.0–15.0)
MCH: 29.5 pg (ref 26.0–34.0)
MCHC: 32.1 g/dL (ref 30.0–36.0)
MCV: 91.8 fL (ref 80.0–100.0)
Platelets: 156 10*3/uL (ref 150–400)
RBC: 4.04 MIL/uL (ref 3.87–5.11)
RDW: 12.7 % (ref 11.5–15.5)
WBC: 6 10*3/uL (ref 4.0–10.5)
nRBC: 0 % (ref 0.0–0.2)

## 2020-12-31 LAB — APTT: aPTT: 24 seconds (ref 24–36)

## 2020-12-31 LAB — SARS CORONAVIRUS 2 (TAT 6-24 HRS): SARS Coronavirus 2: NEGATIVE

## 2020-12-31 LAB — GLUCOSE, CAPILLARY: Glucose-Capillary: 114 mg/dL — ABNORMAL HIGH (ref 70–99)

## 2020-12-31 MED ORDER — LORAZEPAM 2 MG/ML IJ SOLN
0.5000 mg | Freq: Once | INTRAMUSCULAR | Status: AC
  Administered 2020-12-31: 0.5 mg via INTRAVENOUS
  Filled 2020-12-31: qty 1

## 2020-12-31 MED ORDER — ONDANSETRON HCL 4 MG/2ML IJ SOLN: 4.0000 mg | Freq: Three times a day (TID) | INTRAMUSCULAR | Status: DC | PRN

## 2020-12-31 MED ORDER — TRAMADOL HCL 50 MG PO TABS: 50.0000 mg | ORAL_TABLET | Freq: Two times a day (BID) | ORAL | Status: DC | PRN

## 2020-12-31 MED ORDER — ASPIRIN 325 MG PO TABS
325.0000 mg | ORAL_TABLET | Freq: Every day | ORAL | Status: DC
  Administered 2021-01-01: 325 mg via ORAL
  Filled 2020-12-31: qty 1

## 2020-12-31 MED ORDER — ACETAMINOPHEN 325 MG PO TABS: 650.0000 mg | ORAL_TABLET | ORAL | Status: DC | PRN

## 2020-12-31 MED ORDER — ATORVASTATIN CALCIUM 20 MG PO TABS
20.0000 mg | ORAL_TABLET | Freq: Every day | ORAL | Status: DC
  Administered 2021-01-01: 20 mg via ORAL
  Filled 2020-12-31: qty 1

## 2020-12-31 MED ORDER — ALPRAZOLAM 0.5 MG PO TABS: 0.5000 mg | ORAL_TABLET | Freq: Two times a day (BID) | ORAL | Status: DC

## 2020-12-31 MED ORDER — HALOPERIDOL LACTATE 5 MG/ML IJ SOLN
2.0000 mg | Freq: Once | INTRAMUSCULAR | Status: AC
  Administered 2020-12-31: 2 mg via INTRAVENOUS
  Filled 2020-12-31: qty 1

## 2020-12-31 MED ORDER — STROKE: EARLY STAGES OF RECOVERY BOOK: Freq: Once | Status: AC

## 2020-12-31 MED ORDER — PANTOPRAZOLE SODIUM 40 MG PO TBEC
40.0000 mg | DELAYED_RELEASE_TABLET | Freq: Every day | ORAL | Status: DC
  Administered 2021-01-01: 10:00:00 40 mg via ORAL
  Filled 2020-12-31: qty 1

## 2020-12-31 MED ORDER — SENNOSIDES-DOCUSATE SODIUM 8.6-50 MG PO TABS
2.0000 | ORAL_TABLET | Freq: Every day | ORAL | Status: DC
  Administered 2021-01-01: 2 via ORAL
  Filled 2020-12-31: qty 2

## 2020-12-31 MED ORDER — SENNOSIDES-DOCUSATE SODIUM 8.6-50 MG PO TABS: 1.0000 | ORAL_TABLET | Freq: Every evening | ORAL | Status: DC | PRN

## 2020-12-31 MED ORDER — MELATONIN 5 MG PO TABS
2.5000 mg | ORAL_TABLET | Freq: Every day | ORAL | Status: DC
  Filled 2020-12-31 (×2): qty 0.5

## 2020-12-31 MED ORDER — PAROXETINE HCL 30 MG PO TABS
30.0000 mg | ORAL_TABLET | Freq: Every day | ORAL | Status: DC
  Administered 2021-01-01: 10:00:00 30 mg via ORAL
  Filled 2020-12-31: qty 1

## 2020-12-31 MED ORDER — OCUVITE-LUTEIN PO CAPS
1.0000 | ORAL_CAPSULE | Freq: Every day | ORAL | Status: DC
  Administered 2021-01-01: 10:00:00 1 via ORAL
  Filled 2020-12-31: qty 1

## 2020-12-31 MED ORDER — SODIUM CHLORIDE 0.9% FLUSH: 3.0000 mL | Freq: Once | INTRAVENOUS | Status: DC

## 2020-12-31 MED ORDER — PRESERVISION AREDS 2 PO CAPS: 1.0000 | ORAL_CAPSULE | Freq: Every day | ORAL | Status: DC

## 2020-12-31 MED ORDER — DIVALPROEX SODIUM 125 MG PO CSDR
125.0000 mg | DELAYED_RELEASE_CAPSULE | Freq: Two times a day (BID) | ORAL | Status: DC
  Administered 2021-01-01: 125 mg via ORAL
  Filled 2020-12-31 (×3): qty 1

## 2020-12-31 MED ORDER — ALBUTEROL SULFATE HFA 108 (90 BASE) MCG/ACT IN AERS
2.0000 | INHALATION_SPRAY | RESPIRATORY_TRACT | Status: DC | PRN
  Filled 2020-12-31: qty 6.7

## 2020-12-31 MED ORDER — ACETAMINOPHEN 650 MG RE SUPP: 650.0000 mg | RECTAL | Status: DC | PRN

## 2020-12-31 MED ORDER — TRAZODONE HCL 50 MG PO TABS: 50.0000 mg | ORAL_TABLET | Freq: Every day | ORAL | Status: DC

## 2020-12-31 MED ORDER — ACETAMINOPHEN 160 MG/5ML PO SOLN
650.0000 mg | ORAL | Status: DC | PRN
  Filled 2020-12-31: qty 20.3

## 2020-12-31 MED ORDER — SODIUM CHLORIDE 0.9 % IV BOLUS
500.0000 mL | Freq: Once | INTRAVENOUS | Status: AC
  Administered 2020-12-31: 500 mL via INTRAVENOUS

## 2020-12-31 MED ORDER — CALCIUM CARBONATE-VITAMIN D 500-200 MG-UNIT PO TABS: 1.0000 | ORAL_TABLET | Freq: Every day | ORAL | Status: DC

## 2020-12-31 MED ORDER — ENOXAPARIN SODIUM 40 MG/0.4ML ~~LOC~~ SOLN
40.0000 mg | SUBCUTANEOUS | Status: DC
  Administered 2020-12-31: 40 mg via SUBCUTANEOUS
  Filled 2020-12-31: qty 0.4

## 2020-12-31 MED ORDER — ASPIRIN 300 MG RE SUPP
300.0000 mg | Freq: Every day | RECTAL | Status: DC
  Filled 2020-12-31: qty 1

## 2020-12-31 MED ORDER — HALOPERIDOL LACTATE 5 MG/ML IJ SOLN
1.0000 mg | Freq: Once | INTRAMUSCULAR | Status: AC
  Administered 2020-12-31: 17:00:00 1 mg via INTRAVENOUS
  Filled 2020-12-31: qty 1

## 2020-12-31 NOTE — Evaluation (Signed)
Physical Therapy Evaluation Patient Details Name: Shirley Villanueva MRN: [id number] DOB: 06-30-1928 Today's Date: 12/31/2020   History of Present Illness  presented to ER secondary to acute onsest of decreased responsiveness, drooling and R-sided lean; admitted for TIA/CVA work up.  MRI pending.  Clinical Impression  Patient sleeping upon arrival to room, but easily awakens to touch/voice.  Alert, but unable to state name and unable to accurately complete all orientation questions.  Generally hyperverbal throughout session, but speech largely non-sensical and off topic.  Bilat UE/LE strength and ROM grossly symmetrical and WFL for basic transfers and mobility; no obvious focal weakness appreciated.  Do note moderate L LE LLD; wears custom L shoe build up at baseline.  Currently requiring mod assist for bed mobility; min assist for sit/stand basic transfers and gait (100') with RW.  Demonstrates partially reciprocal stepping pattern with inconsistent step height/length/foot placement; forward flexed posture with RW, at times, arms length anterior to patient; constant hand-over-hand guidance for walker management.  Does have significant L LE LLD; must wear personal shoes from home (L build up) for optimal safety/gait performance Do anticipate functional performance to be improved with spontaneous, self-initiated mobility tasks; also anticipate optimal progress and participation with return to familiar environment as possible. Would benefit from skilled PT to address above deficits and promote optimal return to PLOF.; Recommend transition to HHPT upon discharge from acute hospitalization.     Follow Up Recommendations Home health PT    Equipment Recommendations       Recommendations for Other Services       Precautions / Restrictions Precautions Precautions: Fall Restrictions Weight Bearing Restrictions: No      Mobility  Bed Mobility Overal bed mobility: Needs Assistance Bed Mobility:  Supine to Sit;Sit to Supine     Supine to sit: Mod assist Sit to supine: Min assist   General bed mobility comments: hand-over-hand at bilat UEs/LEs to initiate movement; do anticipate increased indep with task when spontaneous and self-initiated    Transfers Overall transfer level: Needs assistance Equipment used: Rolling walker (2 wheeled) Transfers: Sit to/from Stand              Ambulation/Gait Ambulation/Gait assistance: Min assist Gait Distance (Feet): 100 Feet Assistive device: Rolling walker (2 wheeled)       General Gait Details: partially reciprocal stepping pattern with inconsistent step height/length/foot placement; forward flexed posture with RW, at times, arms length anterior to patient; constant hand-over-hand guidance for walker management.  Does have significant L LE LLD; must wear personal shoes from home (L build up) for optimal safety/gait performance  Stairs            Wheelchair Mobility    Modified Rankin (Stroke Patients Only)       Balance Overall balance assessment: Needs assistance Sitting-balance support: No upper extremity supported;Feet supported Sitting balance-Leahy Scale: Fair     Standing balance support: Bilateral upper extremity supported Standing balance-Leahy Scale: Fair                               Pertinent Vitals/Pain Pain Assessment: Faces Faces Pain Scale: No hurt    Home Living Family/patient expects to be discharged to:: Private residence Living Arrangements:  ([name] per chart)   Type of Home: Assisted living       Home Layout: One level        Prior Function  Comments: Patient unable to verbalize/accurately relay.  Per chart, ambulatory with RW, supervision; assist from staff for ADLs as needed.  Baseline alet and oriented x1     Hand Dominance        Extremity/Trunk Assessment   Upper Extremity Assessment Upper Extremity Assessment: Generalized weakness  (grossly at least 4-/5 throughout; no obvious focal weakness observed)    Lower Extremity Assessment Lower Extremity Assessment: Generalized weakness (grossly at least 4-/5 throughout; no obvious focal weakness observed)       Communication   Communication: HOH  Cognition Arousal/Alertness: Awake/alert Behavior During Therapy: WFL for tasks assessed/performed Overall Cognitive Status: No family/caregiver present to determine baseline cognitive functioning                                 General Comments: alert, pleasant and cooperative; unable to participate with meaningful conversation; unable to state name or participate with orientation questions      General Comments      Exercises     Assessment/Plan    PT Assessment Patient needs continued PT services  PT Problem List Decreased mobility;Decreased strength;Decreased activity tolerance;Decreased balance;Decreased cognition;Decreased knowledge of use of DME;Decreased safety awareness;Decreased knowledge of precautions       PT Treatment Interventions DME instruction;Therapeutic activities;Cognitive remediation;Gait training;Therapeutic exercise;Patient/family education;Balance training;Functional mobility training;Neuromuscular re-education    PT Goals (Current goals can be found in the Care Plan section)  Acute Rehab PT Goals PT Goal Formulation: Patient unable to participate in goal setting Time For Goal Achievement: 01/14/21 Potential to Achieve Goals: Fair    Frequency Min 2X/week   Barriers to discharge        Co-evaluation               AM-PAC PT "6 Clicks" Mobility  Outcome Measure Help needed turning from your back to your side while in a flat bed without using bedrails?: A Little Help needed moving from lying on your back to sitting on the side of a flat bed without using bedrails?: A Lot Help needed moving to and from a bed to a chair (including a wheelchair)?: A Little Help needed  standing up from a chair using your arms (e.g., wheelchair or bedside chair)?: A Little Help needed to walk in hospital room?: A Little Help needed climbing 3-5 steps with a railing? : A Lot 6 Click Score: 16    End of Session Equipment Utilized During Treatment: Gait belt Activity Tolerance: Patient tolerated treatment well Patient left: in bed;with call bell/phone within reach;with bed alarm set Nurse Communication: Mobility status PT Visit Diagnosis: Muscle weakness (generalized) (M62.81);Unsteadiness on feet (R26.81)    Time: 1432-1450 PT Time Calculation (min) (ACUTE ONLY): 18 min   Charges:   PT Evaluation $PT Eval Moderate Complexity: 1 Mod         Avaline Stillson H. Manson Passey, PT, DPT, NCS 12/31/20, 11:37 PM [phone]

## 2020-12-31 NOTE — Consult Note (Signed)
Referring Physician: Dr. Roxan Hockey    Chief Complaint: Acute onset of right facial droop.  HPI: Shirley Villanueva is an 87 y.o. female with Lewy body dementia, aphasia and COPD, presenting to the ED via EMS from her assisted living facility after she slumped to the right at her breakfast table with right facial droop and drooling. She was normal on awakening. Acute change noted at 0600. CBG by EMS was 165; HR 70's-80's; BP 140/100; 95% RA. On arrival to the ED, the patient was pleasant and non-agitated, with garbled speech and inability to follow commands.   LSN: [id number] tPA Given: No: Presentation most consistent with acutely altered mental status. No lateralized motor deficit, visual field cut or facial droop. Her speech pattern is more consistent with dysphasia secondary to dementia rather than an acute stroke.   Past Medical History:  Diagnosis Date  . Aphasia   . COPD (chronic obstructive pulmonary disease) (HCC)   . Lack of coordination   . Lewy body dementia (HCC)     History reviewed. No pertinent surgical history.  History reviewed. No pertinent family history. Social History:  reports that she has never smoked. She has never used smokeless tobacco. She reports that she does not drink alcohol and does not use drugs.  Allergies: No Known Allergies  Home Medications:  No current facility-administered medications on file prior to encounter.   Current Outpatient Medications on File Prior to Encounter  Medication Sig Dispense Refill  . ALPRAZolam (XANAX) 0.5 MG tablet Take 0.5 mg by mouth 2 (two) times daily.    Marland Kitchen atorvastatin (LIPITOR) 20 MG tablet Take 20 mg by mouth daily.    . calcium carbonate (OS-CAL - DOSED IN MG OF ELEMENTAL CALCIUM) 1250 (500 Ca) MG tablet Take 1 tablet by mouth.    . Melatonin 3 MG TABS Take 3 mg by mouth at bedtime.    . multivitamin-lutein (OCUVITE-LUTEIN) CAPS capsule Take 1 capsule by mouth daily.    Marland Kitchen omeprazole (PRILOSEC) 20 MG capsule Take 20 mg by  mouth daily.    Marland Kitchen PARoxetine (PAXIL) 30 MG tablet Take 30 mg by mouth daily.    Marland Kitchen senna-docusate (SENOKOT-S) 8.6-50 MG tablet Take 2 tablets by mouth daily.    . traMADol (ULTRAM) 50 MG tablet Take 50 mg by mouth 2 (two) times daily as needed.    . traZODone (DESYREL) 50 MG tablet Take 50 mg by mouth at bedtime.      ROS: Unable to obtain due to dysphasia.    Physical Examination: Blood pressure (!) 156/58, pulse 66, temperature 97.7 F (36.5 C), temperature source Oral, resp. rate 15, height 5\' 4"  (1.626 m), weight 68 kg, SpO2 92 %.  HEENT: [place]/AT Lungs: Respirations unlabored Ext: No edema  Neurologic Examination: Mental Status: Awake with intermittent drowsiness. Severely impaired attention. Speech is with spontaneous, short, stereotyped, fluent, mildly dysarthric but intelligible phrases in response to questions and external stimulation. Speech output does not have any relationship to the questions asked or her external environment. She is pleasant and attempts to cooperate. She smiles frequently and makes eye contact. Does not follow any commands.  Cranial Nerves: II:  Inconsistent blink to threat in bilateral temporal visual fields. PERRL.  III,IV, VI: No ptosis. EOMI. Will track horizontally and fixate normally.  V,VII: Smile symmetric. Reacts to facial light touch bilaterally. Intermittent dyskinetic jaw movements.  VIII: Will react to some questions if they are presented at loud volume.  IX,X: No hypophonia XI: No asymmetry noted.  XII: Does not protrude tongue to command.  Motor: Does not follow motor commands.  To stimulation, she will move BUE antigravity and will grip examiner's hands without asymmetry.  Withdraws BLE briskly to noxious plantar stimulation while exclaiming; no asymmetry noted.  Sensory: Reacts to touch x 4.  Deep Tendon Reflexes:  2+ bilateral brachioradialis and biceps.  Unable to elicit patellar or achilles reflexes in the context of frequent restless  movements which do not stop despite being asked.  Plantars: Equivocal bilaterally  Cerebellar: Does not follow commands for formal testing. Spontaneous movements are without ataxia.  Gait: Deferred  Results for orders placed or performed during the hospital encounter of 12/31/20 (from the past 48 hour(s))  Protime-INR     Status: None   Collection Time: 12/31/20  9:21 AM  Result Value Ref Range   Prothrombin Time 12.8 11.4 - 15.2 seconds   INR 1.0 0.8 - 1.2    Comment: (NOTE) INR goal varies based on device and disease states. Performed at [facility], [address] [place], [place] [address]   APTT     Status: None   Collection Time: 12/31/20  9:21 AM  Result Value Ref Range   aPTT 24 24 - 36 seconds    Comment: Performed at [facility], [address] [place], [place] [address]  CBC     Status: Abnormal   Collection Time: 12/31/20  9:21 AM  Result Value Ref Range   WBC 6.0 4.0 - 10.5 K/uL   RBC 4.04 3.87 - 5.11 MIL/uL   Hemoglobin 11.9 (L) 12.0 - 15.0 g/dL   HCT 41.6 60.6 - 30.1 %   MCV 91.8 80.0 - 100.0 fL   MCH 29.5 26.0 - 34.0 pg   MCHC 32.1 30.0 - 36.0 g/dL   RDW 60.1 09.3 - 23.5 %   Platelets 156 150 - 400 K/uL   nRBC 0.0 0.0 - 0.2 %    Comment: Performed at [facility], [address] [place], [place] [address]  Differential     Status: None   Collection Time: 12/31/20  9:21 AM  Result Value Ref Range   Neutrophils Relative % 69 %   Neutro Abs 4.2 1.7 - 7.7 K/uL   Lymphocytes Relative 21 %   Lymphs Abs 1.3 0.7 - 4.0 K/uL   Monocytes Relative 6 %   Monocytes Absolute 0.4 0.1 - 1.0 K/uL   Eosinophils Relative 3 %   Eosinophils Absolute 0.2 0.0 - 0.5 K/uL   Basophils Relative 1 %   Basophils Absolute 0.1 0.0 - 0.1 K/uL   Immature Granulocytes 0 %   Abs Immature Granulocytes 0.02 0.00 - 0.07 K/uL    Comment: Performed at [facility], [address] [place], [place] [address]  Comprehensive metabolic panel      Status: Abnormal   Collection Time: 12/31/20  9:21 AM  Result Value Ref Range   Sodium 140 135 - 145 mmol/L   Potassium 4.3 3.5 - 5.1 mmol/L   Chloride 106 98 - 111 mmol/L   CO2 28 22 - 32 mmol/L   Glucose, Bld 155 (H) 70 - 99 mg/dL    Comment: Glucose reference range applies only to samples taken after fasting for at least 8 hours.   BUN 37 (H) 8 - 23 mg/dL   Creatinine, Ser 7.06 (H) 0.44 - 1.00 mg/dL   Calcium 8.8 (L) 8.9 - 10.3 mg/dL   Total Protein 5.7 (L) 6.5 - 8.1 g/dL  Albumin 3.3 (L) 3.5 - 5.0 g/dL   AST 18 15 - 41 U/L   ALT 15 0 - 44 U/L   Alkaline Phosphatase 46 38 - 126 U/L   Total Bilirubin 0.7 0.3 - 1.2 mg/dL   GFR, Estimated 53 (L) >60 mL/min    Comment: (NOTE) Calculated using the CKD-EPI Creatinine Equation (2021)    Anion gap 6 5 - 15    Comment: Performed at [facility], [address], [place], [place] [address]   CT HEAD CODE STROKE WO CONTRAST  Result Date: 12/31/2020 CLINICAL DATA:  Code stroke. Acute neuro deficit. Speech disturbance. History of dementia. EXAM: CT HEAD WITHOUT CONTRAST TECHNIQUE: Contiguous axial images were obtained from the base of the skull through the vertex without intravenous contrast. COMPARISON:  08/31/2019 FINDINGS: Brain: There is no evidence of an acute infarct, intracranial hemorrhage, mass effect, or extra-axial fluid collection. Cerebral atrophy is stable to slightly progressive with note made of chronic severe bilateral mesial temporal lobe volume loss. Hypodensities in the cerebral white matter bilaterally are unchanged and nonspecific but compatible with moderate chronic small vessel ischemic disease. A 1 cm lipoma laterally over the left frontotemporal convexity is unchanged. Vascular: Calcified atherosclerosis at the skull base. Skull: No fracture or suspicious osseous lesion. Sinuses/Orbits: Visualized paranasal sinuses and mastoid air cells are clear. Visualized orbits are unremarkable. Other: None. ASPECTS [facility]  Stroke Program Early CT Score) - Ganglionic level infarction (caudate, lentiform nuclei, internal capsule, insula, M1-M3 cortex): 7 - Supraganglionic infarction (M4-M6 cortex): 3 Total score (0-10 with 10 being normal): 10 IMPRESSION: 1. No evidence of acute intracranial abnormality. 2. ASPECTS is 10. 3. Moderate chronic small vessel ischemic disease. 4. Cerebral atrophy with chronically advanced medial temporal lobe volume loss. These results were called by telephone at the time of interpretation on 12/31/2020 at 9:16 am to Dr. Willy Eddy, who verbally acknowledged these results. Electronically Signed   By: Sebastian Ache M.D.   On: 12/31/2020 09:17    Assessment: 87 y.o. female with a history of Lewy body dementia and aphasia, presenting with acute onset of right facial droop. 1. Exam reveals an awake, elderly female with intermittent drowsiness and severely impaired attention. Speech is with spontaneous, short, stereotyped, fluent, mildly dysarthric but intelligible phrases in response to questions and external stimulation. Speech output does not have any relationship to the questions asked or her external environment. She is interactive with staff, but does not follow any commands. No lateralized motor deficit, visual field cut or facial droop.  2. The patient is not a tPA candidate. Presentation is most likely secondary to a spontaneous cognitive fluctuation in the setting of LBD. Her speech pattern is more consistent with dysphasia secondary to dementia rather than an acute stroke.  3. Stroke Risk Factors - advanced age  Recommendations: 1. MRI of the brain without contrast 2. Frequent neuro checks  3. Telemetry monitoring 4. Trial of donepezil.  5. Avoid benzodiazepines and other sedating medications, especially during the day. Continue qhs trazodone.  6. Would not use antipsychotics in this patient, as this class of medications can worsen the cognitive and motor function of LBD patients,  sometimes irreversibly. Could consider low dose Seroquel if absolutely necessary.   @Electronically  signed: Dr.  12/31/2020, 10:18 AM

## 2020-12-31 NOTE — Progress Notes (Signed)
CODE STROKE- PHARMACY COMMUNICATION   Time CODE STROKE called/page received:[phone]  Time response to CODE STROKE was made (in person or via phone): arrived in ED 0857  Time Stroke Kit retrieved from Pyxis (only if needed):N/A  Name of Provider/Nurse contacted:Olivia  Past Medical History:  Diagnosis Date  . Aphasia   . COPD (chronic obstructive pulmonary disease) (HCC)   . Lack of coordination   . Lewy body dementia (HCC)    Prior to Admission medications   Medication Sig Start Date End Date Taking? Authorizing Provider  ALPRAZolam (XANAX) 0.5 MG tablet Take 0.5 mg by mouth 2 (two) times daily. 08/16/19   [provider]  atorvastatin (LIPITOR) 20 MG tablet Take 20 mg by mouth daily. 08/18/19   [provider]  calcium carbonate (OS-CAL - DOSED IN MG OF ELEMENTAL CALCIUM) 1250 (500 Ca) MG tablet Take 1 tablet by mouth.    [provider]  Melatonin 3 MG TABS Take 3 mg by mouth at bedtime.    [provider]  multivitamin-lutein (OCUVITE-LUTEIN) CAPS capsule Take 1 capsule by mouth daily.    [provider]  omeprazole (PRILOSEC) 20 MG capsule Take 20 mg by mouth daily. 08/18/19   [provider]  PARoxetine (PAXIL) 30 MG tablet Take 30 mg by mouth daily. 08/18/19   [provider]  senna-docusate (SENOKOT-S) 8.6-50 MG tablet Take 2 tablets by mouth daily.    [provider]  traMADol (ULTRAM) 50 MG tablet Take 50 mg by mouth 2 (two) times daily as needed. 08/18/19   [provider]  traZODone (DESYREL) 50 MG tablet Take 50 mg by mouth at bedtime. 08/08/19   [provider]     D  ,PharmD Clinical Pharmacist  12/31/2020  9:02 AM   

## 2020-12-31 NOTE — ED Notes (Signed)
Informed RN bed assigned [id number]

## 2020-12-31 NOTE — Progress Notes (Signed)
OT Cancellation Note  Patient Details Name: CATALEIA GADE MRN: [id number] DOB: April 27, 1928   Cancelled Treatment:    Reason Eval/Treat Not Completed: Other (comment). Consult received, chart reviewed. Upon attempt, RN administering medication in preparation for MRI. Will re-attempt OT evaluation next date as appropriate.   Wynona Canes, MPH, MS, OTR/L ascom [phone] 12/31/20, 3:02 PM

## 2020-12-31 NOTE — ED Triage Notes (Signed)
Pt to ED via [facility] with code stroke, per EMS pt was at breakfast table, slumped to the right, r facial droop and began drooling. Upon arrival to ED pt taken to CT by this RN and Stroke Coordinator.

## 2020-12-31 NOTE — TOC Initial Note (Signed)
Transition of Care [facility]) - Initial/Assessment Note    Patient Details  Name: Shirley Villanueva MRN: [id number] Date of Birth: [birth year]  [facility]) CM/SW Contact:    Allayne Butcher, RN Phone Number: 12/31/2020, 1:36 PM  Clinical Narrative:                 Patient placed under observation for altered mental status.   Patient is from [name] and has a legal guardian, Melburn Hake.  RNCM reviewed MOON with Jessica via phone, she verbalizes understanding.  Per Shanda Bumps patient needs assistance and supervision for walking, patient is incontinent.  Plan will be for patient to return to [name] at discharge.  If home health is needed Shanda Bumps has no preference in home health agency.  TOC will cont to follow.  PT and OT pending.    Expected Discharge Plan: Assisted Living Barriers to Discharge: Continued Medical Work up   Patient Goals and CMS Choice Patient states their goals for this hospitalization and ongoing recovery are:: patient is unable to state goals- guardian would like for patient to return to [facility].gov Compare Post Acute Care list provided to:: Legal Guardian Choice offered to / list presented to : [facility] POA / Guardian  Expected Discharge Plan and Services Expected Discharge Plan: Assisted Living   Discharge Planning Services: CM Consult   Living arrangements for the past 2 months: Assisted Living Facility                 DME Arranged: N/A DME Agency: NA                  Prior Living Arrangements/Services Living arrangements for the past 2 months: Assisted Living Facility Lives with:: Facility Resident Patient language and need for interpreter reviewed:: Yes Do you feel safe going back to the place where you live?: Yes      Need for Family Participation in Patient Care: Yes (Comment) (dementia) Care giver support system in place?: Yes (comment) (ALF, legal guardian) Current home services: DME (walker) Criminal  Activity/Legal Involvement Pertinent to Current Situation/Hospitalization: No - Comment as needed  Activities of Daily Living      Permission Sought/Granted Permission sought to share information with : Case Manager,Guardian,Facility Medical sales representative Permission granted to share information with : Yes, Verbal Permission Granted  Share Information with NAME: Theodis Shove  Permission granted to share info w AGENCY: [name]  Permission granted to share info w Relationship: legal guardian     Emotional Assessment Appearance:: Appears stated age     Orientation: : Oriented to Self Alcohol / Substance Use: Not Applicable Psych Involvement: No (comment)  Admission diagnosis:  TIA (transient ischemic attack) [G45.9] Altered mental status, unspecified altered mental status type [R41.82] Patient Active Problem List   Diagnosis Date Noted  . TIA (transient ischemic attack) 12/31/2020  . CKD (chronic kidney disease), stage IIIa 12/31/2020  . HLD (hyperlipidemia) 08/31/2019  . Sepsis (HCC) 08/31/2019  . UTI (urinary tract infection) 08/31/2019  . Lewy body dementia (HCC)   . COPD (chronic obstructive pulmonary disease) (HCC)   . Hypothermia   . Cold exposure   . Elevated troponin   . Leucocytosis    PCP:  Vinnie Level, NP Pharmacy:   [facility], [place] - [address] [place] [address] Phone: [phone] Fax: [phone]     Social Determinants of Health (SDOH) Interventions    Readmission Risk Interventions  No flowsheet data found.

## 2020-12-31 NOTE — Progress Notes (Addendum)
Responded to code stroke, patient taken directly to CT, no family presence ( ward of the state). Please page Chaplain if needed.

## 2020-12-31 NOTE — H&P (Addendum)
History and Physical    Shirley Villanueva WLN:[id number] DOB: Feb 21, 1928 DOA: 12/31/2020  Referring MD/NP/PA:   PCP: Vinnie Level, NP   Patient coming from:  The patient is coming from SNF.  At baseline, pt is dependent for most of ADL.        Chief Complaint: right facial droop  HPI: CHEREESE Villanueva is a 87 y.o. female with medical history significant of hyperlipidemia, COPD, GERD, depression with anxiety, aphasia, lack of coordination, Lewy body dementia, CKD stage IIIa, who presents with right facial droop.  Per report, pt was last known normal at about 6:00 AM. Pt slumped to the right at her breakfast table at about 6:30 AM. She was found to have right facial droop and drooling. Her symptoms have resolved in ED. Per her legal guardian Child psychotherapist (I called her by phone), normally patient is alert, but most of the time she is not orientated x3.  Currently patient is alert, not orientated x3.  She moves all extremities currently.  No active nausea, vomiting, diarrhea, respiratory distress, cough noted.  Patient does not seem to have pain anywhere.  ED Course: pt was found to have WBC 6.0, INR 1.0, PTT 24, pending COVID-19 PCR, creatinine 1.01, BUN 37, temperature normal, blood pressure 156/58, heart rate 66, RR 15, oxygen saturation 95--92% on room air.  Chest x-ray showed mild vascular congestion. CT head is negative for acute intracranial abnormalities.  Dr. Otelia Limes of neurology is consulted    Review of Systems: Could not be reviewed due to dementia  Allergy: No Known Allergies  Past Medical History:  Diagnosis Date  . Aphasia   . COPD (chronic obstructive pulmonary disease) (HCC)   . Lack of coordination   . Lewy body dementia (HCC)     History reviewed. No pertinent surgical history.   Could not be reviewed due to dementia    Social History:  reports that she has never smoked. She has never used smokeless tobacco. She reports that she does not drink alcohol and does  not use drugs.  Family History: History reviewed. No pertinent family history. Could not be reviewed due to dementia    Prior to Admission medications   Medication Sig Start Date End Date Taking? Authorizing Provider  ALPRAZolam Prudy Feeler) 0.5 MG tablet Take 0.5 mg by mouth 2 (two) times daily. 08/16/19   [provider]  atorvastatin (LIPITOR) 20 MG tablet Take 20 mg by mouth daily. 08/18/19   [provider]  calcium carbonate (OS-CAL - DOSED IN MG OF ELEMENTAL CALCIUM) 1250 (500 Ca) MG tablet Take 1 tablet by mouth.    [provider]  Melatonin 3 MG TABS Take 3 mg by mouth at bedtime.    [provider]  multivitamin-lutein (OCUVITE-LUTEIN) CAPS capsule Take 1 capsule by mouth daily.    [provider]  omeprazole (PRILOSEC) 20 MG capsule Take 20 mg by mouth daily. 08/18/19   [provider]  PARoxetine (PAXIL) 30 MG tablet Take 30 mg by mouth daily. 08/18/19   [provider]  senna-docusate (SENOKOT-S) 8.6-50 MG tablet Take 2 tablets by mouth daily.    [provider]  traMADol (ULTRAM) 50 MG tablet Take 50 mg by mouth 2 (two) times daily as needed. 08/18/19   [provider]  traZODone (DESYREL) 50 MG tablet Take 50 mg by mouth at bedtime. 08/08/19   [provider]    Physical Exam: Vitals:   12/31/20 0929 12/31/20 0930 12/31/20 1000  12/31/20 1137  BP:  (!) 162/113 (!) 156/58 (!) 160/79  Pulse:  66 66 64  Resp:  15 15 16   Temp:    98.1 F (36.7 C)  TempSrc:      SpO2:  95% 92% 96%  Weight: 68 kg     Height: 5\' 4"  (1.626 m)      General: Not in acute distress HEENT:       Eyes: PERRL, EOMI, no scleral icterus.       ENT: No discharge from the ears and nose, no pharynx injection, no tonsillar enlargement.        Neck: No JVD, no bruit, no mass felt. Heme: No neck lymph node enlargement. Cardiac: S1/S2, RRR, No murmurs, No gallops or rubs. Respiratory: No rales, wheezing, rhonchi or  rubs. GI: Soft, nondistended, nontender, no organomegaly, BS present. GU: No hematuria Ext: No pitting leg edema bilaterally. 1+DP/PT pulse bilaterally. Musculoskeletal: No joint deformities, No joint redness or warmth, no limitation of ROM in spin. Skin: No rashes.  Neuro: Alert, not  oriented X3, partially following command, cranial nerves II-XII grossly intact, moves all extremities normally. Psych: Patient is not psychotic, no suicidal or hemocidal ideation.  Labs on Admission: I have personally reviewed following labs and imaging studies  CBC: Recent Labs  Lab 12/31/20 0921  WBC 6.0  NEUTROABS 4.2  HGB 11.9*  HCT 37.1  MCV 91.8  PLT 156   Basic Metabolic Panel: Recent Labs  Lab 12/31/20 0921  NA 140  K 4.3  CL 106  CO2 28  GLUCOSE 155*  BUN 37*  CREATININE 1.01*  CALCIUM 8.8*   GFR: Estimated Creatinine Clearance: 35.1 mL/min (A) (by C-G formula based on SCr of 1.01 mg/dL (H)). Liver Function Tests: Recent Labs  Lab 12/31/20 0921  AST 18  ALT 15  ALKPHOS 46  BILITOT 0.7  PROT 5.7*  ALBUMIN 3.3*   No results for input(s): LIPASE, AMYLASE in the last 168 hours. No results for input(s): AMMONIA in the last 168 hours. Coagulation Profile: Recent Labs  Lab 12/31/20 0921  INR 1.0   Cardiac Enzymes: No results for input(s): CKTOTAL, CKMB, CKMBINDEX, TROPONINI in the last 168 hours. BNP (last 3 results) No results for input(s): PROBNP in the last 8760 hours. HbA1C: No results for input(s): HGBA1C in the last 72 hours. CBG: Recent Labs  Lab 12/31/20 0856  GLUCAP 114*   Lipid Profile: No results for input(s): CHOL, HDL, LDLCALC, TRIG, CHOLHDL, LDLDIRECT in the last 72 hours. Thyroid Function Tests: No results for input(s): TSH, T4TOTAL, FREET4, T3FREE, THYROIDAB in the last 72 hours. Anemia Panel: No results for input(s): VITAMINB12, FOLATE, FERRITIN, TIBC, IRON, RETICCTPCT in the last 72 hours. Urine analysis:    Component Value Date/Time    COLORURINE YELLOW (A) 08/31/2019 0859   APPEARANCEUR CLOUDY (A) 08/31/2019 0859   APPEARANCEUR Clear 05/26/2013 1604   LABSPEC 1.010 08/31/2019 0859   LABSPEC 1.017 05/26/2013 1604   PHURINE 7.0 08/31/2019 0859   GLUCOSEU NEGATIVE 08/31/2019 0859   GLUCOSEU Negative 05/26/2013 1604   HGBUR NEGATIVE 08/31/2019 0859   BILIRUBINUR NEGATIVE 08/31/2019 0859   BILIRUBINUR Negative 05/26/2013 1604   KETONESUR NEGATIVE 08/31/2019 0859   PROTEINUR NEGATIVE 08/31/2019 0859   NITRITE NEGATIVE 08/31/2019 0859   LEUKOCYTESUR MODERATE (A) 08/31/2019 0859   LEUKOCYTESUR Negative 05/26/2013 1604   Sepsis Labs: @LABRCNTIP (procalcitonin:4,lacticidven:4) )No results found for this or any previous visit (from the past 240 hour(s)).   Radiological Exams on Admission: DG Chest Portable  1 View  Result Date: 12/31/2020 CLINICAL DATA:  Altered mental status. Evaluate for edema or infiltrate. EXAM: PORTABLE CHEST 1 VIEW COMPARISON:  Chest x-ray 08/31/2019 FINDINGS: Heart size normal. Mild pulmonary vascular congestion present without frank edema. Left greater than right bibasilar scarring or atelectasis present. No significant airspace consolidation is present. Atherosclerotic changes are noted at the aortic arch. IMPRESSION: 1. Mild pulmonary vascular congestion without frank edema. 2. Bibasilar scarring or atelectasis. Electronically Signed   By: Marin Robertshristopher  Mattern M.D.   On: 12/31/2020 11:03   CT HEAD CODE STROKE WO CONTRAST  Result Date: 12/31/2020 CLINICAL DATA:  Code stroke. Acute neuro deficit. Speech disturbance. History of dementia. EXAM: CT HEAD WITHOUT CONTRAST TECHNIQUE: Contiguous axial images were obtained from the base of the skull through the vertex without intravenous contrast. COMPARISON:  08/31/2019 FINDINGS: Brain: There is no evidence of an acute infarct, intracranial hemorrhage, mass effect, or extra-axial fluid collection. Cerebral atrophy is stable to slightly progressive with note made of  chronic severe bilateral mesial temporal lobe volume loss. Hypodensities in the cerebral white matter bilaterally are unchanged and nonspecific but compatible with moderate chronic small vessel ischemic disease. A 1 cm lipoma laterally over the left frontotemporal convexity is unchanged. Vascular: Calcified atherosclerosis at the skull base. Skull: No fracture or suspicious osseous lesion. Sinuses/Orbits: Visualized paranasal sinuses and mastoid air cells are clear. Visualized orbits are unremarkable. Other: None. ASPECTS [facility]([place] Stroke Program Early CT Score) - Ganglionic level infarction (caudate, lentiform nuclei, internal capsule, insula, M1-M3 cortex): 7 - Supraganglionic infarction (M4-M6 cortex): 3 Total score (0-10 with 10 being normal): 10 IMPRESSION: 1. No evidence of acute intracranial abnormality. 2. ASPECTS is 10. 3. Moderate chronic small vessel ischemic disease. 4. Cerebral atrophy with chronically advanced medial temporal lobe volume loss. These results were called by telephone at the time of interpretation on 12/31/2020 at 9:16 am to Dr. Willy EddyPatrick Robinson, who verbally acknowledged these results. Electronically Signed   By: Sebastian AcheAllen  Grady M.D.   On: 12/31/2020 09:17     EKG: I have personally reviewed.  Sinus rhythm, QTC 484, bifascicular block, poor R wave progression  Assessment/Plan Principal Problem:   TIA (transient ischemic attack) Active Problems:   Lewy body dementia (HCC)   COPD (chronic obstructive pulmonary disease) (HCC)   HLD (hyperlipidemia)   CKD (chronic kidney disease), stage IIIa   Depression with anxiety   Possible TIA (transient ischemic attack): Patient symptoms are concerning for possible TIA versus small stroke.  CT head negative for acute intracranial abnormalities.  Dr. Otelia LimesLindzen of neurology is consulted.  -Placed on MedSurg bed for observation - will follow up Neurology's Recs.  - Obtain MRI  -  ASA and lipitor - fasting lipid panel and HbA1c  -  swallowing screen. If fails, will get SLP - PT/OT consult  Lewy body dementia [facility](HCC): No acute issues -Frequent neuro check  Depression with anxiety: -Continue home Xanax, Depakote, Paxil,  COPD (chronic obstructive pulmonary disease) (HCC): Stable -As needed albuterol  HLD (hyperlipidemia) -Lipitor  CKD (chronic kidney disease), stage IIIa: Stable -Follow-up with BMP    DVT ppx: SQ Lovenox Code Status: Full code (per her legal guardian Child psychotherapistsocial worker, it is not very clear about pt's CODE STATUS, but she agreed that we consider pt as full code now)  family Communication: I called her legal guardian Child psychotherapistsocial worker  Disposition Plan:  Anticipate discharge back to previous environment Consults called:  Dr. Otelia LimesLindzen Admission status and Level of care: Med-Surg for obs  Status is: Observation  The patient remains OBS appropriate and will d/c before 2 midnights.  Dispo: The patient is from: SNF              Anticipated d/c is to: SNF              Patient currently is not medically stable to d/c.   Difficult to place patient No         Date of Service 12/31/2020    Lorretta Harp [facility]   If 7PM-7AM, please contact night-coverage [email or web address] 12/31/2020, 3:12 PM

## 2020-12-31 NOTE — Care Management Obs Status (Signed)
MEDICARE OBSERVATION STATUS NOTIFICATION   Patient Details  Name: JOSALYNN JOHNDROW MRN: [id number] Date of Birth: 1928-08-01   Medicare Observation Status Notification Given:  Yes    Allayne Butcher, RN 12/31/2020, 2:27 PM

## 2020-12-31 NOTE — Code Documentation (Signed)
Code stroke canceled @ 0918 per Dr. Otelia Limes

## 2020-12-31 NOTE — ED Provider Notes (Signed)
[facility] Emergency Department Provider Note    Event Date/Time   First MD Initiated Contact with Patient 12/31/20 [phone]     (approximate)  I have reviewed the triage vital signs and the nursing notes.   HISTORY  Chief Complaint Code Stroke  Level v Caveat:  Dementia  HPI Shirley Villanueva is a 87 y.o. female below listed past medical history presents to the ER from facility due to drowsiness drooling and leaning to the right side this was witnessed around 630.  She was seen normal around 6:00.  She was given trazodone this morning.  No fevers.  Patient pleasant, laughing, clapping upon arrival.      Past Medical History:  Diagnosis Date  . Aphasia   . COPD (chronic obstructive pulmonary disease) (HCC)   . Lack of coordination   . Lewy body dementia (HCC)    History reviewed. No pertinent family history. History reviewed. No pertinent surgical history. Patient Active Problem List   Diagnosis Date Noted  . TIA (transient ischemic attack) 12/31/2020  . HLD (hyperlipidemia) 08/31/2019  . Sepsis (HCC) 08/31/2019  . UTI (urinary tract infection) 08/31/2019  . Lewy body dementia (HCC)   . COPD (chronic obstructive pulmonary disease) (HCC)   . Hypothermia   . Cold exposure   . Elevated troponin   . Leucocytosis       Prior to Admission medications   Medication Sig Start Date End Date Taking? Authorizing Provider  ALPRAZolam Prudy Feeler) 0.5 MG tablet Take 0.5 mg by mouth 2 (two) times daily. 08/16/19   [provider]  atorvastatin (LIPITOR) 20 MG tablet Take 20 mg by mouth daily. 08/18/19   [provider]  calcium carbonate (OS-CAL - DOSED IN MG OF ELEMENTAL CALCIUM) 1250 (500 Ca) MG tablet Take 1 tablet by mouth.    [provider]  Melatonin 3 MG TABS Take 3 mg by mouth at bedtime.    [provider]  multivitamin-lutein (OCUVITE-LUTEIN) CAPS capsule Take 1 capsule by mouth daily.    [provider]   omeprazole (PRILOSEC) 20 MG capsule Take 20 mg by mouth daily. 08/18/19   [provider]  PARoxetine (PAXIL) 30 MG tablet Take 30 mg by mouth daily. 08/18/19   [provider]  senna-docusate (SENOKOT-S) 8.6-50 MG tablet Take 2 tablets by mouth daily.    [provider]  traMADol (ULTRAM) 50 MG tablet Take 50 mg by mouth 2 (two) times daily as needed. 08/18/19   [provider]  traZODone (DESYREL) 50 MG tablet Take 50 mg by mouth at bedtime. 08/08/19   [provider]    Allergies Patient has no known allergies.    Social History Social History   Tobacco Use  . Smoking status: Never Smoker  . Smokeless tobacco: Never Used  Substance Use Topics  . Alcohol use: Never  . Drug use: Never    Review of Systems Patient denies headaches, rhinorrhea, blurry vision, numbness, shortness of breath, chest pain, edema, cough, abdominal pain, nausea, vomiting, diarrhea, dysuria, fevers, rashes or hallucinations unless otherwise stated above in HPI. ____________________________________________   PHYSICAL EXAM:  VITAL SIGNS: Vitals:   12/31/20 0930 12/31/20 1000  BP: (!) 162/113 (!) 156/58  Pulse: 66 66  Resp: 15 15  Temp:    SpO2: 95% 92%    Constitutional: Alert and oriented.  Eyes: Conjunctivae are normal.  Head: Atraumatic. Nose: No congestion/rhinnorhea. Mouth/Throat: Mucous membranes are moist.   Neck: No stridor. Painless ROM.  Cardiovascular: Normal rate, regular rhythm. Grossly normal heart sounds.  Good peripheral circulation. Respiratory: Normal respiratory effort.  No retractions. Lungs CTAB. Gastrointestinal: Soft and nontender. No distention. No abdominal bruits. No CVA tenderness. Genitourinary: deferred Musculoskeletal: No lower extremity tenderness nor edema.  No joint effusions. Neurologic:  CN- intact.  No facial droop, Sensation intact bilaterally.  No gross focal neurologic deficits are appreciated.  Skin:  Skin  is warm, dry and intact. No rash noted. Psychiatric: pleasant and cooperative  ____________________________________________   LABS (all labs ordered are listed, but only abnormal results are displayed)  Results for orders placed or performed during the hospital encounter of 12/31/20 (from the past 24 hour(s))  Protime-INR     Status: None   Collection Time: 12/31/20  9:21 AM  Result Value Ref Range   Prothrombin Time 12.8 11.4 - 15.2 seconds   INR 1.0 0.8 - 1.2  APTT     Status: None   Collection Time: 12/31/20  9:21 AM  Result Value Ref Range   aPTT 24 24 - 36 seconds  CBC     Status: Abnormal   Collection Time: 12/31/20  9:21 AM  Result Value Ref Range   WBC 6.0 4.0 - 10.5 K/uL   RBC 4.04 3.87 - 5.11 MIL/uL   Hemoglobin 11.9 (L) 12.0 - 15.0 g/dL   HCT 27.0 62.3 - 76.2 %   MCV 91.8 80.0 - 100.0 fL   MCH 29.5 26.0 - 34.0 pg   MCHC 32.1 30.0 - 36.0 g/dL   RDW 83.1 51.7 - 61.6 %   Platelets 156 150 - 400 K/uL   nRBC 0.0 0.0 - 0.2 %  Differential     Status: None   Collection Time: 12/31/20  9:21 AM  Result Value Ref Range   Neutrophils Relative % 69 %   Neutro Abs 4.2 1.7 - 7.7 K/uL   Lymphocytes Relative 21 %   Lymphs Abs 1.3 0.7 - 4.0 K/uL   Monocytes Relative 6 %   Monocytes Absolute 0.4 0.1 - 1.0 K/uL   Eosinophils Relative 3 %   Eosinophils Absolute 0.2 0.0 - 0.5 K/uL   Basophils Relative 1 %   Basophils Absolute 0.1 0.0 - 0.1 K/uL   Immature Granulocytes 0 %   Abs Immature Granulocytes 0.02 0.00 - 0.07 K/uL  Comprehensive metabolic panel     Status: Abnormal   Collection Time: 12/31/20  9:21 AM  Result Value Ref Range   Sodium 140 135 - 145 mmol/L   Potassium 4.3 3.5 - 5.1 mmol/L   Chloride 106 98 - 111 mmol/L   CO2 28 22 - 32 mmol/L   Glucose, Bld 155 (H) 70 - 99 mg/dL   BUN 37 (H) 8 - 23 mg/dL   Creatinine, Ser 0.73 (H) 0.44 - 1.00 mg/dL   Calcium 8.8 (L) 8.9 - 10.3 mg/dL   Total Protein 5.7 (L) 6.5 - 8.1 g/dL   Albumin 3.3 (L) 3.5 - 5.0 g/dL   AST 18  15 - 41 U/L   ALT 15 0 - 44 U/L   Alkaline Phosphatase 46 38 - 126 U/L   Total Bilirubin 0.7 0.3 - 1.2 mg/dL   GFR, Estimated 53 (L) >60 mL/min   Anion gap 6 5 - 15   ____________________________________________  EKG My review and personal interpretation at Time: 9:14   Indication: ams  Rate: 80  Rhythm: sinus Axis: left Other: normal, no stemi ____________________________________________  RADIOLOGY  I personally reviewed all radiographic images  ordered to evaluate for the above acute complaints and reviewed radiology reports and findings.  These findings were personally discussed with the patient.  Please see medical record for radiology report.  ____________________________________________   PROCEDURES  Procedure(s) performed:  Procedures    Critical Care performed: no ____________________________________________   INITIAL IMPRESSION / ASSESSMENT AND PLAN / ED COURSE  Pertinent labs & imaging results that were available during my care of the patient were reviewed by me and considered in my medical decision making (see chart for details).   DDX: Dehydration, sepsis, pna, uti, hypoglycemia, cva, drug effect, withdrawal, encephalitis  Shirley Villanueva is a 87 y.o. who presents to the ED with presentation as described above.  Patient made code stroke via EMS.  On arrival she does not have any focal deficits.  She is evaluate by neurology with normal CT head point code stroke work was canceled as she would not be a candidate for TPA nor did she have any objective findings to suggest CVA.  Will order blood work will observe in the ER  Clinical Course as of 12/31/20 1033  Fri Dec 31, 2020  1019 Blood work and imaging thus far is reassuring.  After discussion consultation with Dr. Otelia Limes of neurology has recommended MRI as well as admission to the hospital for further monitoring.  Will consult hospitalist for admission. [PR]    Clinical Course User Index [PR] Willy Eddy,  MD    The patient was evaluated in Emergency Department today for the symptoms described in the history of present illness. He/she was evaluated in the context of the global COVID-19 pandemic, which necessitated consideration that the patient might be at risk for infection with the SARS-CoV-2 virus that causes COVID-19. Institutional protocols and algorithms that pertain to the evaluation of patients at risk for COVID-19 are in a state of rapid change based on information released by regulatory bodies including the CDC and federal and state organizations. These policies and algorithms were followed during the patient's care in the ED.  As part of my medical decision making, I reviewed the following data within the electronic MEDICAL RECORD NUMBER Nursing notes reviewed and incorporated, Labs reviewed, notes from prior ED visits and [place] Controlled Substance Database   ____________________________________________   FINAL CLINICAL IMPRESSION(S) / ED DIAGNOSES  Final diagnoses:  Altered mental status, unspecified altered mental status type      NEW MEDICATIONS STARTED DURING THIS VISIT:  New Prescriptions   No medications on file     Note:  This document was prepared using Dragon voice recognition software and may include unintentional dictation errors.    Willy Eddy, MD 12/31/20 [phone]

## 2021-01-01 DIAGNOSIS — N1831 Chronic kidney disease, stage 3a: Secondary | ICD-10-CM | POA: Diagnosis not present

## 2021-01-01 DIAGNOSIS — G459 Transient cerebral ischemic attack, unspecified: Secondary | ICD-10-CM | POA: Diagnosis not present

## 2021-01-01 LAB — LIPID PANEL
Cholesterol: 122 mg/dL (ref 0–200)
HDL: 53 mg/dL (ref >40)
LDL Cholesterol: 50 mg/dL (ref 0–99)
Total CHOL/HDL Ratio: 2.3 RATIO
Triglycerides: 93 mg/dL (ref <150)
VLDL: 19 mg/dL (ref 0–40)

## 2021-01-01 LAB — HEMOGLOBIN A1C
Hgb A1c MFr Bld: 5.4 % (ref 4.8–5.6)
Mean Plasma Glucose: 108.28 mg/dL

## 2021-01-01 MED ORDER — AMLODIPINE BESYLATE 5 MG PO TABS: 5.0000 mg | ORAL_TABLET | Freq: Every day | ORAL | 0 refills | Status: DC

## 2021-01-01 MED ORDER — ASPIRIN EC 81 MG PO TBEC: 81.0000 mg | DELAYED_RELEASE_TABLET | Freq: Every day | ORAL | 2 refills | Status: DC

## 2021-01-01 MED ORDER — AMLODIPINE BESYLATE 5 MG PO TABS
5.0000 mg | ORAL_TABLET | Freq: Every day | ORAL | Status: DC
  Administered 2021-01-01: 12:00:00 5 mg via ORAL
  Filled 2021-01-01: qty 1

## 2021-01-01 MED ORDER — HALOPERIDOL LACTATE 5 MG/ML IJ SOLN
2.5000 mg | Freq: Four times a day (QID) | INTRAMUSCULAR | Status: DC | PRN
  Administered 2021-01-01: 01:00:00 2.5 mg via INTRAVENOUS
  Filled 2021-01-01: qty 1

## 2021-01-01 NOTE — Plan of Care (Signed)
Shift Summary: UTA orientation, remains on RA, NSR on telemetry. No s/s pain. Granddaughter at bedside overnight, noted that this is patient's baseline mentation. B/l mittens in place for line safety. Telesitter in place though pt is unable to follow commands. PRN haldol given x2 for restlessness/agitation with little relief. Adequate UO, BM overnight. Able to turn self in bed. Pending MRI and ultrasound today, will likely need PRN medication for MRI d/t inability to lay still for extended period of time. NPO until evaluated by SLP. Fall/safety precautions in place, rounding performed, needs/concerns addressed.   Problem: Clinical Measurements: Goal: Will remain free from infection Outcome: Progressing   Problem: Activity: Goal: Risk for activity intolerance will decrease Outcome: Progressing   Problem: Nutrition: Goal: Adequate nutrition will be maintained Outcome: Progressing   Problem: Elimination: Goal: Will not experience complications related to bowel motility Outcome: Progressing Goal: Will not experience complications related to urinary retention Outcome: Progressing   Problem: Pain Managment: Goal: General experience of comfort will improve Outcome: Progressing   Problem: Safety: Goal: Ability to remain free from injury will improve Outcome: Progressing   Problem: Skin Integrity: Goal: Risk for impaired skin integrity will decrease Outcome: Progressing

## 2021-01-01 NOTE — Progress Notes (Signed)
Patients fall alert bracelet, tennis shoes, clothes and upper dentures returned with EMS, patient had no lower dentures upper dentures where in.

## 2021-01-01 NOTE — Progress Notes (Signed)
Speech Language Pathology Evaluation Patient Details Name: Shirley Villanueva MRN: [id number] DOB: 1928/04/26 Today's Date: 01/01/2021 Time: 6734-1937 SLP Time Calculation (min) (ACUTE ONLY): 17 min  Problem List:  Patient Active Problem List   Diagnosis Date Noted   TIA (transient ischemic attack) 12/31/2020   CKD (chronic kidney disease), stage IIIa 12/31/2020   Depression with anxiety 12/31/2020   HLD (hyperlipidemia) 08/31/2019   Sepsis (HCC) 08/31/2019   UTI (urinary tract infection) 08/31/2019   Lewy body dementia (HCC)    COPD (chronic obstructive pulmonary disease) (HCC)    Hypothermia    Cold exposure    Elevated troponin    Leucocytosis    Past Medical History:  Past Medical History:  Diagnosis Date   Aphasia    COPD (chronic obstructive pulmonary disease) (HCC)    Lack of coordination    Lewy body dementia (HCC)    Past Surgical History: History reviewed. No pertinent surgical history. HPI:  Patient is an 87 y.o. female with past medical history of Lewy body dementia, COPD, GERD, aphasia, CKD stage IIIa, who presented from ALF with right facial droop and drooling. Symptoms resolved in ED; code stroke was cancelled. Passed [employer] Screen and on soft diet per MD. Estill Bamberg evaluation ordered.   Assessment / Plan / Recommendation Clinical Impression   Patient presents with severe cognitive-linguistic deficits consistent with baseline function/ Lewy body dementia. No focal weakness upon oral motor examination. Sustained attention is severely impaired; she hold this only for periods of 5-20 seconds. Patient does not follow commands or respond appropriately to verbal questions aside from nodding when asked if her name is Shirley Villanueva. Suspect she may have some degree of hearing loss. She was able to read simple questions and commands of 3-5 words out loud. She answered personal Yes/No questions when written, however unable to establish if her responses are reliable. She did  not attempt to follow any written commands despite being able to read them. Her speech is fluent, at times jargon/word salad, and at times with normal syntax but not appropriate to situation. She mentioned seeing a boy outside her window and in the corner of the room, and appeared to speak to him several times. Due to severity of deficits, unable to complete a standardized cognitive assessment. Per chart review, pt's granddaughter has been present and reports this is her baseline. At this time no further skilled ST is indicated.     SLP Assessment  SLP Recommendation/Assessment: Patient does not need any further Speech Lanaguage Pathology Services SLP Visit Diagnosis: Aphasia (R47.01);Cognitive communication deficit (R41.841);Attention and concentration deficit Attention and concentration deficit following: Other cerebrovascular disease    Follow Up Recommendations  24 hour supervision/assistance;Other (comment) (return to facility)    Frequency and Duration Other (Comment) (eval only)         SLP Evaluation Cognition  Overall Cognitive Status: History of cognitive impairments - at baseline Arousal/Alertness: Awake/alert Orientation Level: Oriented to person;Disoriented to place;Disoriented to time;Disoriented to situation Attention: Sustained Sustained Attention: Impaired Sustained Attention Impairment: Verbal basic;Functional basic Memory: Impaired Memory Impairment: Other (comment) (unable to recall personal details) Problem Solving: Impaired Problem Solving Impairment:  (impaired due to lower level attention/memory impairments) Executive Function:  (impaired due to lower level attention/memory impairments) Behaviors: Restless;Confabulation (Talking about a boy outside the window and in the corner of the room) Safety/Judgment: Impaired       Comprehension  Auditory Comprehension Overall Auditory Comprehension: Impaired Yes/No Questions: Impaired Basic Biographical Questions:  0-25% accurate Basic Immediate Environment  Questions: 0-24% accurate Commands: Impaired One Step Basic Commands: 0-24% accurate Conversation: Other (comment) (one-sided) Other Conversation Comments: very conversant but does not respond appropriately; word salad Interfering Components: Attention;Hearing EffectiveTechniques: Other (Comment);Visual/Gestural cues (Patient could not comprehend verbal questions but did answer simple Y/N with written questions) Visual Recognition/Discrimination Discrimination: Not tested Reading Comprehension Reading Status: Impaired Sentence Level: Impaired (reads simple questions and commands of 3-5 words. Answers Y/N but does not follow written commands)    Expression Expression Primary Mode of Expression: Verbal Verbal Expression Overall Verbal Expression: Impaired at baseline Initiation: No impairment Automatic Speech:  (does not respond appropriately to prompts) Level of Generative/Spontaneous Verbalization: Conversation (word salad with islands of fluent speech with normal syntax but not appropriate to situation or question) Repetition: Impaired Level of Impairment: Word level Naming: Impairment Confrontation: Impaired (toothbrush: "is that a pencil?") Verbal Errors: Semantic paraphasias;Confabulation;Jargon;Language of confusion;Not aware of errors Pragmatics: Impairment Impairments: Topic appropriateness;Topic maintenance Interfering Components: Attention Effective Techniques: Written cues Non-Verbal Means of Communication: Not applicable Written Expression Dominant Hand:  (UTA) Written Expression: Not tested   Oral / Motor  Oral Motor/Sensory Function Overall Oral Motor/Sensory Function: Within functional limits Motor Speech Overall Motor Speech: Appears within functional limits for tasks assessed Respiration: Within functional limits Phonation: Normal Resonance: Within functional limits Articulation: Within functional  limitis Intelligibility: Intelligible (although language is often incomprehensible) Motor Planning:  (UTA due to inability to follow commands)   GO                   Rondel Baton, MS, CCC-SLP Speech-Language Pathologist  Arlana Lindau 01/01/2021, 10:44 AM

## 2021-01-01 NOTE — Evaluation (Signed)
Occupational Therapy Evaluation Patient Details Name: Shirley Villanueva MRN: [id number] DOB: 02-21-1928 Today's Date: 01/01/2021    History of Present Illness 87yo female presented to ER secondary to acute onsest of decreased responsiveness, drooling and R-sided lean; admitted for TIA/CVA work up.  MRI pending.   Clinical Impression   Pt seen for OT evaluation. Pt alert, restless, and pleasantly confused. Unable to appropriately respond to questions despite being hyper verbal, often nonsensical/word salad. Pt requires MAX A and hand over hand assist to engage in grooming tasks this date, unable to follow simple verbal commands paired with visual cues. MAX A for repositioning in bed to minimize risk of pressure wounds. Pt would benefit from skilled OT services to maximize meaningful engagement in occupations in order to maximize independence and quality of life while also minimizing caregiver burden or need for higher level of care. Recommend HHOT services and 24/7 supervision/assist at pt's facility.     Follow Up Recommendations  Supervision/Assistance - 24 hour;Home health OT (Back to ALF with HHOT)    Equipment Recommendations  None recommended by OT    Recommendations for Other Services       Precautions / Restrictions Precautions Precautions: Fall Restrictions Weight Bearing Restrictions: No      Mobility Bed Mobility               General bed mobility comments: deferred 2/2 safety    Transfers                      Balance                                           ADL either performed or assessed with clinical judgement   ADL Overall ADL's : Needs assistance/impaired                                       General ADL Comments: Pt requires MOD-MAX A for all basic ADL tasks with visual demo and hand over hand assist to attempt and pt unable to initiate or complete without assist     Vision   Vision Assessment?: No  apparent visual deficits     Perception     Praxis      Pertinent Vitals/Pain Pain Assessment: Faces Faces Pain Scale: No hurt     Hand Dominance  (UTA)   Extremity/Trunk Assessment Upper Extremity Assessment Upper Extremity Assessment: Generalized weakness;Difficult to assess due to impaired cognition   Lower Extremity Assessment Lower Extremity Assessment: Generalized weakness;Difficult to assess due to impaired cognition       Communication Communication Communication: HOH   Cognition Arousal/Alertness: Awake/alert Behavior During Therapy: Restless Overall Cognitive Status: History of cognitive impairments - at baseline                                 General Comments: Pt pleasant, unable to meaningfully communicate or respond to questions or commands   General Comments       Exercises Other Exercises Other Exercises: OT facilitated grooming at bed level, pt very fidgety, restless, requiring MAX A and hand over hand assist to brush teeth. Unable to complete after repetition without MAX A. Repositioning for improved posture and comfort requiring  MAX A from therapist.   Shoulder Instructions      Home Living Family/patient expects to be discharged to:: Private residence Living Arrangements:  ([name] per chart)   Type of Home: Assisted living       Home Layout: One level                          Prior Functioning/Environment          Comments: Patient unable to verbalize/accurately relay.  Per chart, ambulatory with RW, supervision; assist from staff for ADLs as needed.  Baseline alet and oriented x1        OT Problem List: Decreased cognition;Decreased activity tolerance;Decreased safety awareness;Decreased strength;Impaired balance (sitting and/or standing);Decreased knowledge of use of DME or AE      OT Treatment/Interventions: Self-care/ADL training;Therapeutic exercise;Therapeutic activities;Cognitive  remediation/compensation;DME and/or AE instruction;Patient/family education;Balance training    OT Goals(Current goals can be found in the care plan section) Acute Rehab OT Goals Patient Stated Goal: pt unable to state OT Goal Formulation: Patient unable to participate in goal setting Time For Goal Achievement: 01/15/21 Potential to Achieve Goals: Fair ADL Goals Additional ADL Goal #1: Pt will perform bed mobility with baseline assist from caregiver Additional ADL Goal #2: Pt will meaningfully participate in seated ADL tasks with set up and hand over hand assist with visual demo to support understanding. Additional ADL Goal #3: Pt will perform morning ADL routine at baseline assist level for caregiver to maximize safety and return to [facility].  OT Frequency: Min 1X/week   Barriers to D/C:            Co-evaluation              AM-PAC OT "6 Clicks" Daily Activity     Outcome Measure Help from another person eating meals?: A Lot Help from another person taking care of personal grooming?: A Lot Help from another person toileting, which includes using toliet, bedpan, or urinal?: A Lot Help from another person bathing (including washing, rinsing, drying)?: A Lot Help from another person to put on and taking off regular upper body clothing?: A Lot Help from another person to put on and taking off regular lower body clothing?: A Lot 6 Click Score: 12   End of Session    Activity Tolerance: Patient tolerated treatment well Patient left: in bed;with call bell/phone within reach;with bed alarm set;Other (comment) (mitts reapplied)  OT Visit Diagnosis: Other abnormalities of gait and mobility (R26.89);Other symptoms and signs involving cognitive function                Time: 1150-1204 OT Time Calculation (min): 14 min Charges:  OT General Charges $OT Visit: 1 Visit OT Evaluation $OT Eval High Complexity: 1 High OT Treatments $Self Care/Home Management : 8-22 mins  Wynona Canes, MPH,  MS, OTR/L ascom [phone] 01/01/21, 2:15 PM

## 2021-01-01 NOTE — [place] FL2 (Signed)
[place] MEDICAID FL2 LEVEL OF CARE SCREENING TOOL     IDENTIFICATION  Patient Name: Shirley Villanueva Birthdate: [birth year] Sex: female Admission Date (Current Location): 12/31/2020  [facility] and [place] Number:  Chiropodist and Address:  [facility], [address] [place], [place] [address]      Provider Number: [id number]  Attending Physician Name and Address:  Enedina Finner, MD  Relative Name and Phone Number:  Melburn Hake (legal guardian) [phone]    Current Level of Care: Hospital Recommended Level of Care: Assisted Living Facility Prior Approval Number:    Date Approved/Denied:   PASRR Number:    Discharge Plan: Domiciliary (Rest home)    Current Diagnoses: Patient Active Problem List   Diagnosis Date Noted  . TIA (transient ischemic attack) 12/31/2020  . CKD (chronic kidney disease), stage IIIa 12/31/2020  . Depression with anxiety 12/31/2020  . HLD (hyperlipidemia) 08/31/2019  . Sepsis (HCC) 08/31/2019  . UTI (urinary tract infection) 08/31/2019  . Lewy body dementia (HCC)   . COPD (chronic obstructive pulmonary disease) (HCC)   . Hypothermia   . Cold exposure   . Elevated troponin   . Leucocytosis     Orientation RESPIRATION BLADDER Height & Weight     Self  Normal Incontinent Weight: 150 lb (68 kg) Height:  5\' 4"  (162.6 cm)  BEHAVIORAL SYMPTOMS/MOOD NEUROLOGICAL BOWEL NUTRITION STATUS      Incontinent Diet  AMBULATORY STATUS COMMUNICATION OF NEEDS Skin   Limited Assist Verbally Normal                       Personal Care Assistance Level of Assistance  Bathing,Feeding,Dressing Bathing Assistance: Limited assistance Feeding assistance: Limited assistance Dressing Assistance: Limited assistance     Functional Limitations Info             SPECIAL CARE FACTORS FREQUENCY  PT (By licensed PT),OT (By licensed OT)     PT Frequency: home health OT Frequency: home health            Contractures  Contractures Info: Not present    Additional Factors Info  Code Status,Allergies Code Status Info: full code Allergies Info: nka           Medication List    TAKE these medications   ALPRAZolam 0.5 MG tablet Commonly known as: XANAX Take 0.5 mg by mouth 2 (two) times daily.   amLODipine 5 MG tablet Commonly known as: NORVASC Take 1 tablet (5 mg total) by mouth daily.   aspirin EC 81 MG tablet Take 1 tablet (81 mg total) by mouth daily. Swallow whole.   atorvastatin 20 MG tablet Commonly known as: LIPITOR Take 20 mg by mouth daily.   calcium carbonate 1250 (500 Ca) MG tablet Commonly known as: OS-CAL - dosed in mg of elemental calcium Take 1 tablet by mouth.   Calcium Carbonate-Vitamin D3 600-400 MG-UNIT Tabs Take 1 tablet by mouth at bedtime.   divalproex 125 MG capsule Commonly known as: DEPAKOTE SPRINKLE Take 125 mg by mouth 2 (two) times daily.   melatonin 3 MG Tabs tablet Take 3 mg by mouth at bedtime.   multivitamin-lutein Caps capsule Take 1 capsule by mouth daily.   PreserVision AREDS 2 Caps Take 1 capsule by mouth in the morning and at bedtime.   omeprazole 20 MG capsule Commonly known as: PRILOSEC Take 20 mg by mouth daily.   PARoxetine 30 MG tablet Commonly known as: PAXIL Take 30 mg  by mouth daily.   senna-docusate 8.6-50 MG tablet Commonly known as: Senokot-S Take 2 tablets by mouth daily.   traMADol 50 MG tablet Commonly known as: ULTRAM Take 50 mg by mouth 2 (two) times daily as needed.   traZODone 50 MG tablet Commonly known as: DESYREL Take 50 mg by mouth at bedtime.      Relevant Imaging Results:  Relevant Lab Results:   Additional Information SS #: [id number]  Taresa Montville E Deslyn Cavenaugh, LCSW

## 2021-01-01 NOTE — Discharge Summary (Signed)
[facility] -  at [facility]   PATIENT NAME: Shirley Villanueva    MR#:  [id number]  DATE OF BIRTH:  09/18/1928  DATE OF ADMISSION:  12/31/2020 ADMITTING PHYSICIAN: Lorretta HarpXilin Niu, MD  DATE OF DISCHARGE: 01/01/2021  PRIMARY CARE PHYSICIAN: Vinnie LevelWilliams, Julonda N, NP    ADMISSION DIAGNOSIS:  TIA (transient ischemic attack) [G45.9] Altered mental status, unspecified altered mental status type [R41.82]  DISCHARGE DIAGNOSIS:   Altered mental status suspect due to advanced dementia   SECONDARY DIAGNOSIS:   Past Medical History:  Diagnosis Date  . Aphasia   . COPD (chronic obstructive pulmonary disease) (HCC)   . Lack of coordination   . Lewy body dementia [facility](HCC)     HOSPITAL COURSE:   Shirley Villanueva is a 87 y.o. female with medical history significant of hyperlipidemia, COPD, GERD, depression with anxiety, aphasia, lack of coordination, Lewy body dementia, CKD stage IIIa, who presents with right facial droop.  Altered mental status suspect due to advanced dementia ?Possible TIA  - CT head negative for acute intracranial abnormalities.  Dr. Otelia LimesLindzen of neurology is consulted--does not seem to think this is TIA or CVA. No facial droop noted on exam yday --asa --MRI brain x 2 cancelled due to pt cooperation -  ASA and lipitor - swallowing screen done--soft diet - PT/OT consult--HHPT  Lewy body dementia (HCC): No acute issues  Depression with anxiety: -Continue home Xanax, Depakote, Paxil  COPD (chronic obstructive pulmonary disease) (HCC): Stable -As needed albuterol  HLD (hyperlipidemia) -Lipitor  CKD (chronic kidney disease), stage IIIa: Stable  HTN --start po amlodipine  patient has back to her routine baseline. Neuro- exam nonfocal. I did leave message for [name] social worker to call me back. TOC was able to get in touch with [name] on call social worker and they are in agreement with patient returning back to [facility].  DVT ppx: SQ  Lovenox Code Status: Full code (per her legal guardian Child psychotherapistsocial worker, it is not very clear about pt's CODE STATUS, but she agreed that we consider pt as full code now--per Dr Clyde LundborgNiu)  family Communication: I called her legal guardian social worker --left message today Disposition Plan:  Anticipate discharge back to previous environment Consults called:  Dr. Otelia LimesLindzen Admission status and Level of care: Med-Surg for obs    Status is: Observation  The patient remains OBS appropriate and will d/c before 2 midnights.  Dispo: The patient is from: ALF  Anticipated d/c is to: ALF  Patient currently is  medically best at baseline to d/c.              Difficult to place patient No    CONSULTS OBTAINED:    DRUG ALLERGIES:  No Known Allergies  DISCHARGE MEDICATIONS:   Allergies as of 01/01/2021   No Known Allergies     Medication List    TAKE these medications   ALPRAZolam 0.5 MG tablet Commonly known as: XANAX Take 0.5 mg by mouth 2 (two) times daily.   amLODipine 5 MG tablet Commonly known as: NORVASC Take 1 tablet (5 mg total) by mouth daily.   aspirin EC 81 MG tablet Take 1 tablet (81 mg total) by mouth daily. Swallow whole.   atorvastatin 20 MG tablet Commonly known as: LIPITOR Take 20 mg by mouth daily.   calcium carbonate 1250 (500 Ca) MG tablet Commonly known as: OS-CAL - dosed in mg of elemental calcium Take 1 tablet by mouth.   Calcium Carbonate-Vitamin D3  600-400 MG-UNIT Tabs Take 1 tablet by mouth at bedtime.   divalproex 125 MG capsule Commonly known as: DEPAKOTE SPRINKLE Take 125 mg by mouth 2 (two) times daily.   melatonin 3 MG Tabs tablet Take 3 mg by mouth at bedtime.   multivitamin-lutein Caps capsule Take 1 capsule by mouth daily.   PreserVision AREDS 2 Caps Take 1 capsule by mouth in the morning and at bedtime.   omeprazole 20 MG capsule Commonly known as: PRILOSEC Take 20 mg by mouth daily.   PARoxetine 30 MG  tablet Commonly known as: PAXIL Take 30 mg by mouth daily.   senna-docusate 8.6-50 MG tablet Commonly known as: Senokot-S Take 2 tablets by mouth daily.   traMADol 50 MG tablet Commonly known as: ULTRAM Take 50 mg by mouth 2 (two) times daily as needed.   traZODone 50 MG tablet Commonly known as: DESYREL Take 50 mg by mouth at bedtime.       If you experience worsening of your admission symptoms, develop shortness of breath, life threatening emergency, suicidal or homicidal thoughts you must seek medical attention immediately by calling 911 or calling your MD immediately  if symptoms less severe.  You Must read complete instructions/literature along with all the possible adverse reactions/side effects for all the Medicines you take and that have been prescribed to you. Take any new Medicines after you have completely understood and accept all the possible adverse reactions/side effects.   Please note  You were cared for by a hospitalist during your hospital stay. If you have any questions about your discharge medications or the care you received while you were in the hospital after you are discharged, you can call the unit and asked to speak with the hospitalist on call if the hospitalist that took care of you is not available. Once you are discharged, your primary care physician will handle any further medical issues. Please note that NO REFILLS for any discharge medications will be authorized once you are discharged, as it is imperative that you return to your primary care physician (or establish a relationship with a primary care physician if you do not have one) for your aftercare needs so that they can reassess your need for medications and monitor your lab values. Today   SUBJECTIVE   Confused Eating ok   VITAL SIGNS:  Blood pressure (!) 156/103, pulse 97, temperature 99.5 F (37.5 C), temperature source Oral, resp. rate 16, height 5\' 4"  (1.626 m), weight 68 kg, SpO2 94  %.  I/O:    Intake/Output Summary (Last 24 hours) at 01/01/2021 1201 Last data filed at 01/01/2021 1004 Gross per 24 hour  Intake 120 ml  Output 401 ml  Net -281 ml    PHYSICAL EXAMINATION:  GENERAL:  87 y.o.-year-old patient lying in the bed with no acute distress.  LUNGS: Normal breath sounds bilaterally, no wheezing, rales,rhonchi or crepitation. No use of accessory muscles of respiration.  CARDIOVASCULAR: S1, S2 normal. No murmurs, rubs, or gallops.  ABDOMEN: Soft, non-tender, non-distended.  EXTREMITIES: No pedal edema, cyanosis, or clubbing.  NEUROLOGIC:non focal, no facial drool PSYCHIATRIC: The patient is confused at baseline SKIN: No obvious rash, lesion, or ulcer.   DATA REVIEW:   CBC  Recent Labs  Lab 12/31/20 0921  WBC 6.0  HGB 11.9*  HCT 37.1  PLT 156    Chemistries  Recent Labs  Lab 12/31/20 0921  NA 140  K 4.3  CL 106  CO2 28  GLUCOSE 155*  BUN 37*  CREATININE 1.01*  CALCIUM 8.8*  AST 18  ALT 15  ALKPHOS 46  BILITOT 0.7    Microbiology Results   Recent Results (from the past 240 hour(s))  SARS CORONAVIRUS 2 (TAT 6-24 HRS) Nasopharyngeal Nasopharyngeal Swab     Status: None   Collection Time: 12/31/20  9:21 AM   Specimen: Nasopharyngeal Swab  Result Value Ref Range Status   SARS Coronavirus 2 NEGATIVE NEGATIVE Final    Comment: (NOTE) SARS-CoV-2 target nucleic acids are NOT DETECTED.  The SARS-CoV-2 RNA is generally detectable in upper and lower respiratory specimens during the acute phase of infection. Negative results do not preclude SARS-CoV-2 infection, do not rule out co-infections with other pathogens, and should not be used as the sole basis for treatment or other patient management decisions. Negative results must be combined with clinical observations, patient history, and epidemiological information. The expected result is Negative.  Fact Sheet for Patients: HairSlick.no  Fact Sheet for  Healthcare Providers: quierodirigir.com  This test is not yet approved or cleared by the [place] FDA and  has been authorized for detection and/or diagnosis of SARS-CoV-2 by FDA under an Emergency Use Authorization (EUA). This EUA will remain  in effect (meaning this test can be used) for the duration of the COVID-19 declaration under Se ction 564(b)(1) of the Act, 21 U.S.C. section 360bbb-3(b)(1), unless the authorization is terminated or revoked sooner.  Performed at [facility], [address], [place], [place] [address]     RADIOLOGY:  DG Chest Portable 1 View  Result Date: 12/31/2020 CLINICAL DATA:  Altered mental status. Evaluate for edema or infiltrate. EXAM: PORTABLE CHEST 1 VIEW COMPARISON:  Chest x-ray 08/31/2019 FINDINGS: Heart size normal. Mild pulmonary vascular congestion present without frank edema. Left greater than right bibasilar scarring or atelectasis present. No significant airspace consolidation is present. Atherosclerotic changes are noted at the aortic arch. IMPRESSION: 1. Mild pulmonary vascular congestion without frank edema. 2. Bibasilar scarring or atelectasis. Electronically Signed   By: Marin Roberts M.D.   On: 12/31/2020 11:03   CT HEAD CODE STROKE WO CONTRAST  Result Date: 12/31/2020 CLINICAL DATA:  Code stroke. Acute neuro deficit. Speech disturbance. History of dementia. EXAM: CT HEAD WITHOUT CONTRAST TECHNIQUE: Contiguous axial images were obtained from the base of the skull through the vertex without intravenous contrast. COMPARISON:  08/31/2019 FINDINGS: Brain: There is no evidence of an acute infarct, intracranial hemorrhage, mass effect, or extra-axial fluid collection. Cerebral atrophy is stable to slightly progressive with note made of chronic severe bilateral mesial temporal lobe volume loss. Hypodensities in the cerebral white matter bilaterally are unchanged and nonspecific but compatible with moderate  chronic small vessel ischemic disease. A 1 cm lipoma laterally over the left frontotemporal convexity is unchanged. Vascular: Calcified atherosclerosis at the skull base. Skull: No fracture or suspicious osseous lesion. Sinuses/Orbits: Visualized paranasal sinuses and mastoid air cells are clear. Visualized orbits are unremarkable. Other: None. ASPECTS [facility] Stroke Program Early CT Score) - Ganglionic level infarction (caudate, lentiform nuclei, internal capsule, insula, M1-M3 cortex): 7 - Supraganglionic infarction (M4-M6 cortex): 3 Total score (0-10 with 10 being normal): 10 IMPRESSION: 1. No evidence of acute intracranial abnormality. 2. ASPECTS is 10. 3. Moderate chronic small vessel ischemic disease. 4. Cerebral atrophy with chronically advanced medial temporal lobe volume loss. These results were called by telephone at the time of interpretation on 12/31/2020 at 9:16 am to Dr. Willy Eddy, who verbally acknowledged these results. Electronically Signed   By: Freida Busman  Mosetta Putt M.D.   On: 12/31/2020 09:17     CODE STATUS:     Code Status Orders  (From admission, onward)         Start     Ordered   12/31/20 1137  Full code  Continuous        12/31/20 1136        Code Status History    Date Active Date Inactive Code Status Order ID Comments User Context   08/31/2019 1337 09/01/2019 1859 Full Code [id number]  Lorretta Harp, MD ED   08/31/2019 0646 08/31/2019 1337 Full Code [id number]  Loleta Rose, MD ED   Advance Care Planning Activity       TOTAL TIME TAKING CARE OF THIS PATIENT: *35* minutes.    Enedina Finner M.D  [facility]    CC: Primary care physician; Vinnie Level, NP

## 2021-01-01 NOTE — TOC Progression Note (Addendum)
Transition of Care [facility]) - Progression Note    Patient Details  Name: Shirley Villanueva MRN: [id number] Date of Birth: 07-07-1928  Transition of Care [facility]) CM/SW Contact  Liliana Cline, LCSW Phone Number: 01/01/2021, 10:43 AM  Clinical Narrative:       Home health recommended. Referral made to [facility] with [facility], waiting for confirmation if they can accept patient. Spoke to [facility] Tammy who reported they only use [facility]. Reached out to [place] with [facility] and waiting to hear back.     Expected Discharge Plan: Assisted Living Barriers to Discharge: Continued Medical Work up  Expected Discharge Plan and Services Expected Discharge Plan: Assisted Living   Discharge Planning Services: CM Consult   Living arrangements for the past 2 months: Assisted Living Facility                 DME Arranged: N/A DME Agency: NA                   Social Determinants of Health (SDOH) Interventions    Readmission Risk Interventions No flowsheet data found.

## 2021-01-01 NOTE — TOC Transition Note (Addendum)
Transition of Care [facility]) - CM/SW Discharge Note   Patient Details  Name: Shirley Villanueva MRN: [id number] Date of Birth: 1927/10/16  Transition of Care [facility]) CM/SW Contact:  Liliana Cline, LCSW Phone Number: 01/01/2021, 12:27 PM   Clinical Narrative:   Patient to discharge back to [name] today. Confirmed with Fleet Contras at [facility] [name] and Tammy at [facility]. Home Health PT/OT recommended and has been arranged through [facility] per ALF's request. Tammy requested RN call report to Liborio Nixon at [phone]. COVID test negative on 4/8. Tammy reported they do not have transportation on weekends and requested Nonemergent EMS for patient. EMS Paperwork and updated FL2 printed and Nurse Secretery to put in DC [place]. CSW will call EMS when RN is ready.  2:02[facility] called for [employer]. Patient is next on list when a truck is available. RN notified.  3:11- Call from [facility] asking to confirm which address for [name]. Called [name] main line, [place], and [place]. Left messages for each to confirm address in chart, no response. Provided address in chart to EMS.  Final next level of care: Assisted Living Barriers to Discharge: Barriers Resolved   Patient Goals and CMS Choice Patient states their goals for this hospitalization and ongoing recovery are:: ALF with home health CMS Medicare.gov Compare Post Acute Care list provided to:: Legal Guardian Choice offered to / list presented to : [facility] POA / Guardian  Discharge Placement              Patient chooses bed at:  ([name] with [facility] (per ALF and [name] Legal Guardian)) Patient to be transferred to facility by: EMS [name] Patient and family notified of of transfer: 01/01/21  Discharge Plan and Services   Discharge Planning Services: CM Consult            DME Arranged: N/A DME Agency: NA                   Social Determinants of Health (SDOH) Interventions     Readmission Risk Interventions No flowsheet data found.

## 2021-01-01 NOTE — Progress Notes (Signed)
Called and gave report to Willene Hatchet the SIC at [facility] [phone] per Ashley Royalty at [place] that is who I was to call report to.

## 2021-01-01 NOTE — Progress Notes (Signed)
Pt's granddaughter Faye Ramsay [phone]. Family contact. Visited with patient overnight, will be back during day.

## 2021-12-12 ENCOUNTER — Other Ambulatory Visit: Payer: Self-pay

## 2021-12-12 ENCOUNTER — Encounter: Payer: Self-pay | Admitting: Emergency Medicine

## 2021-12-12 ENCOUNTER — Emergency Department: Payer: Medicare Other

## 2021-12-12 ENCOUNTER — Inpatient Hospital Stay
Admission: EM | Admit: 2021-12-12 | Discharge: 2021-12-16 | DRG: 177 | Disposition: A | Discharge status: Hospice | Payer: Medicare Other | Source: Skilled Nursing Facility | Attending: Internal Medicine | Admitting: Internal Medicine

## 2021-12-12 DIAGNOSIS — F0283 Dementia in other diseases classified elsewhere, unspecified severity, with mood disturbance: Secondary | ICD-10-CM | POA: Diagnosis not present

## 2021-12-12 DIAGNOSIS — N390 Urinary tract infection, site not specified: Secondary | ICD-10-CM | POA: Diagnosis present

## 2021-12-12 DIAGNOSIS — F329 Major depressive disorder, single episode, unspecified: Secondary | ICD-10-CM | POA: Diagnosis present

## 2021-12-12 DIAGNOSIS — R4182 Altered mental status, unspecified: Principal | ICD-10-CM

## 2021-12-12 DIAGNOSIS — Z79899 Other long term (current) drug therapy: Secondary | ICD-10-CM | POA: Diagnosis not present

## 2021-12-12 DIAGNOSIS — U071 COVID-19: Secondary | ICD-10-CM | POA: Diagnosis present

## 2021-12-12 DIAGNOSIS — F0284 Dementia in other diseases classified elsewhere, unspecified severity, with anxiety: Secondary | ICD-10-CM | POA: Diagnosis present

## 2021-12-12 DIAGNOSIS — J441 Chronic obstructive pulmonary disease with (acute) exacerbation: Secondary | ICD-10-CM | POA: Diagnosis present

## 2021-12-12 DIAGNOSIS — S0083XA Contusion of other part of head, initial encounter: Secondary | ICD-10-CM | POA: Diagnosis present

## 2021-12-12 DIAGNOSIS — J449 Chronic obstructive pulmonary disease, unspecified: Secondary | ICD-10-CM | POA: Diagnosis not present

## 2021-12-12 DIAGNOSIS — N1831 Chronic kidney disease, stage 3a: Secondary | ICD-10-CM | POA: Diagnosis present

## 2021-12-12 DIAGNOSIS — G9341 Metabolic encephalopathy: Secondary | ICD-10-CM | POA: Diagnosis present

## 2021-12-12 DIAGNOSIS — B370 Candidal stomatitis: Secondary | ICD-10-CM | POA: Diagnosis present

## 2021-12-12 DIAGNOSIS — G8929 Other chronic pain: Secondary | ICD-10-CM | POA: Diagnosis present

## 2021-12-12 DIAGNOSIS — B9620 Unspecified Escherichia coli [E. coli] as the cause of diseases classified elsewhere: Secondary | ICD-10-CM

## 2021-12-12 DIAGNOSIS — W1830XA Fall on same level, unspecified, initial encounter: Secondary | ICD-10-CM | POA: Diagnosis present

## 2021-12-12 DIAGNOSIS — R4701 Aphasia: Secondary | ICD-10-CM | POA: Diagnosis present

## 2021-12-12 DIAGNOSIS — F0280 Dementia in other diseases classified elsewhere without behavioral disturbance: Secondary | ICD-10-CM | POA: Diagnosis present

## 2021-12-12 DIAGNOSIS — I129 Hypertensive chronic kidney disease with stage 1 through stage 4 chronic kidney disease, or unspecified chronic kidney disease: Secondary | ICD-10-CM | POA: Diagnosis present

## 2021-12-12 DIAGNOSIS — E43 Unspecified severe protein-calorie malnutrition: Secondary | ICD-10-CM | POA: Diagnosis present

## 2021-12-12 DIAGNOSIS — G3183 Dementia with Lewy bodies: Secondary | ICD-10-CM | POA: Diagnosis present

## 2021-12-12 DIAGNOSIS — R627 Adult failure to thrive: Secondary | ICD-10-CM

## 2021-12-12 DIAGNOSIS — Z6825 Body mass index (BMI) 25.0-25.9, adult: Secondary | ICD-10-CM

## 2021-12-12 DIAGNOSIS — E875 Hyperkalemia: Secondary | ICD-10-CM | POA: Diagnosis present

## 2021-12-12 DIAGNOSIS — E860 Dehydration: Secondary | ICD-10-CM | POA: Diagnosis present

## 2021-12-12 DIAGNOSIS — R001 Bradycardia, unspecified: Secondary | ICD-10-CM | POA: Diagnosis not present

## 2021-12-12 DIAGNOSIS — R296 Repeated falls: Secondary | ICD-10-CM | POA: Diagnosis present

## 2021-12-12 DIAGNOSIS — I441 Atrioventricular block, second degree: Secondary | ICD-10-CM

## 2021-12-12 DIAGNOSIS — E870 Hyperosmolality and hypernatremia: Secondary | ICD-10-CM | POA: Diagnosis present

## 2021-12-12 DIAGNOSIS — N179 Acute kidney failure, unspecified: Secondary | ICD-10-CM

## 2021-12-12 DIAGNOSIS — N189 Chronic kidney disease, unspecified: Secondary | ICD-10-CM | POA: Diagnosis not present

## 2021-12-12 DIAGNOSIS — Z7982 Long term (current) use of aspirin: Secondary | ICD-10-CM | POA: Diagnosis not present

## 2021-12-12 LAB — URINALYSIS, COMPLETE (UACMP) WITH MICROSCOPIC
Bilirubin Urine: NEGATIVE
Glucose, UA: NEGATIVE mg/dL
Hgb urine dipstick: NEGATIVE
Ketones, ur: 5 mg/dL — AB
Nitrite: NEGATIVE
Protein, ur: 100 mg/dL — AB
Specific Gravity, Urine: 1.021 (ref 1.005–1.030)
WBC, UA: >50 WBC/hpf — ABNORMAL HIGH (ref 0–5)
pH: 5 (ref 5.0–8.0)

## 2021-12-12 LAB — CBC WITH DIFFERENTIAL/PLATELET
Abs Immature Granulocytes: 0.12 10*3/uL — ABNORMAL HIGH (ref 0.00–0.07)
Basophils Absolute: 0.1 10*3/uL (ref 0.0–0.1)
Basophils Relative: 1 %
Eosinophils Absolute: 0.1 10*3/uL (ref 0.0–0.5)
Eosinophils Relative: 1 %
HCT: 42.1 % (ref 36.0–46.0)
Hemoglobin: 12.7 g/dL (ref 12.0–15.0)
Immature Granulocytes: 1 %
Lymphocytes Relative: 19 %
Lymphs Abs: 1.9 10*3/uL (ref 0.7–4.0)
MCH: 28.5 pg (ref 26.0–34.0)
MCHC: 30.2 g/dL (ref 30.0–36.0)
MCV: 94.6 fL (ref 80.0–100.0)
Monocytes Absolute: 0.8 10*3/uL (ref 0.1–1.0)
Monocytes Relative: 8 %
Neutro Abs: 7.5 10*3/uL (ref 1.7–7.7)
Neutrophils Relative %: 70 %
Platelets: 350 10*3/uL (ref 150–400)
RBC: 4.45 MIL/uL (ref 3.87–5.11)
RDW: 13.9 % (ref 11.5–15.5)
WBC: 10.5 10*3/uL (ref 4.0–10.5)
nRBC: 0 % (ref 0.0–0.2)

## 2021-12-12 LAB — COMPREHENSIVE METABOLIC PANEL
ALT: 18 U/L (ref 0–44)
AST: 25 U/L (ref 15–41)
Albumin: 3.3 g/dL — ABNORMAL LOW (ref 3.5–5.0)
Alkaline Phosphatase: 65 U/L (ref 38–126)
Anion gap: 12 (ref 5–15)
BUN: UNDETERMINED mg/dL (ref 8–23)
CO2: 26 mmol/L (ref 22–32)
Calcium: 7.4 mg/dL — ABNORMAL LOW (ref 8.9–10.3)
Chloride: 117 mmol/L — ABNORMAL HIGH (ref 98–111)
Creatinine, Ser: 2.3 mg/dL — ABNORMAL HIGH (ref 0.44–1.00)
GFR, Estimated: 20 mL/min — ABNORMAL LOW (ref >60)
Glucose, Bld: 102 mg/dL — ABNORMAL HIGH (ref 70–99)
Potassium: 5.7 mmol/L — ABNORMAL HIGH (ref 3.5–5.1)
Sodium: 155 mmol/L — ABNORMAL HIGH (ref 135–145)
Total Bilirubin: 0.7 mg/dL (ref 0.3–1.2)
Total Protein: 6.4 g/dL — ABNORMAL LOW (ref 6.5–8.1)

## 2021-12-12 LAB — MAGNESIUM: Magnesium: 2.2 mg/dL (ref 1.7–2.4)

## 2021-12-12 LAB — TROPONIN I (HIGH SENSITIVITY)
Troponin I (High Sensitivity): 28 ng/L — ABNORMAL HIGH (ref <18)
Troponin I (High Sensitivity): 29 ng/L — ABNORMAL HIGH (ref <18)

## 2021-12-12 LAB — T4, FREE: Free T4: 1.3 ng/dL — ABNORMAL HIGH (ref 0.61–1.12)

## 2021-12-12 LAB — BUN: BUN: 97 mg/dL — ABNORMAL HIGH (ref 8–23)

## 2021-12-12 LAB — TSH: TSH: 1.349 u[IU]/mL (ref 0.350–4.500)

## 2021-12-12 LAB — RESP PANEL BY RT-PCR (FLU A&B, COVID) ARPGX2
Influenza A by PCR: NEGATIVE
Influenza B by PCR: NEGATIVE
SARS Coronavirus 2 by RT PCR: POSITIVE — AB

## 2021-12-12 MED ORDER — DEXTROSE 5 % IV SOLN: Freq: Once | INTRAVENOUS | Status: AC

## 2021-12-12 MED ORDER — LACTATED RINGERS IV BOLUS
1000.0000 mL | Freq: Once | INTRAVENOUS | Status: AC
Start: 2021-12-12 — End: 2021-12-12
  Administered 2021-12-12: 1000 mL via INTRAVENOUS

## 2021-12-12 MED ORDER — SODIUM CHLORIDE 0.9 % IV SOLN
1.0000 g | Freq: Once | INTRAVENOUS | Status: AC
  Administered 2021-12-12: 1 g via INTRAVENOUS
  Filled 2021-12-12: qty 10

## 2021-12-12 MED ORDER — HEPARIN SODIUM (PORCINE) 5000 UNIT/ML IJ SOLN
5000.0000 [IU] | Freq: Three times a day (TID) | INTRAMUSCULAR | Status: DC
  Administered 2021-12-12 – 2021-12-16 (×11): 5000 [IU] via SUBCUTANEOUS
  Filled 2021-12-12 (×11): qty 1

## 2021-12-12 MED ORDER — DEXAMETHASONE SODIUM PHOSPHATE 10 MG/ML IJ SOLN
6.0000 mg | INTRAMUSCULAR | Status: DC
  Administered 2021-12-12 – 2021-12-15 (×4): 6 mg via INTRAVENOUS
  Filled 2021-12-12 (×4): qty 1

## 2021-12-12 MED ORDER — CALCIUM GLUCONATE-NACL 1-0.675 GM/50ML-% IV SOLN
1.0000 g | Freq: Once | INTRAVENOUS | Status: AC
  Administered 2021-12-12: 1000 mg via INTRAVENOUS
  Filled 2021-12-12: qty 50

## 2021-12-12 MED ORDER — SODIUM CHLORIDE 0.9 % IV BOLUS: 1000.0000 mL | Freq: Once | INTRAVENOUS | Status: DC

## 2021-12-12 NOTE — Progress Notes (Signed)
Patient was alert and disoriented upon arrival. Was restless throughout most of the visit. NOK granddaughter was present and emotive throughout visit. Spiritual care was provided through normalizing feelings, validating emotions and being a non judgmental presence. Visit was appreciated. ?

## 2021-12-12 NOTE — ED Notes (Addendum)
[name]--grandaughter ? [phone] ? ?** call with any updates ** ?

## 2021-12-12 NOTE — ED Provider Notes (Signed)
? ?[facility] ?Provider Note ? ? ? Event Date/Time  ? First MD Initiated Contact with Patient 12/12/21 1504   ?  (approximate) ? ? ?History  ? ?Altered Mental Status ? ? ?HPI ? ?Shirley Villanueva is a 88 y.o. female with past medical history of COPD, Lewy body dementia, CKD, chronic pain on tramadol who presents with altered mental status.  Patient is coming from her facility.  Apparently she had COVID about a week ago but tested negative several days ago.  Since that time she has somewhat declined has been more weak and has had several falls.  Unclear if falls are witnessed but she does have bruising on her head.  Patient unable to provide history secondary to altered mental status. ? ?Spoke with patient's granddaughter Shirley Villanueva who notes that normally she is able to hold a conversation although somewhat difficult to understand at times and not currently this is significantly off of her baseline.  She is not talking and much more somnolent than normal.  She notes that she is not sure of patient's CODE STATUS.  Her legal guardian is [name], although patient currently is being evaluated for hospice. ?  ? ?Past Medical History:  ?Diagnosis Date  ? Aphasia   ? COPD (chronic obstructive pulmonary disease) ([place])   ? Lack of coordination   ? Lewy body dementia ([place])   ? ? ?Patient Active Problem List  ? Diagnosis Date Noted  ? TIA (transient ischemic attack) 12/31/2020  ? CKD (chronic kidney disease), stage IIIa 12/31/2020  ? Depression with anxiety 12/31/2020  ? HLD (hyperlipidemia) 08/31/2019  ? Sepsis ([place]) 08/31/2019  ? UTI (urinary tract infection) 08/31/2019  ? Lewy body dementia ([place])   ? COPD (chronic obstructive pulmonary disease) ([place])   ? Hypothermia   ? Cold exposure   ? Elevated troponin   ? Leucocytosis   ? ? ? ?Physical Exam  ?Triage Vital Signs: ?ED Triage Vitals  ?Enc Vitals Group  ?   BP 12/12/21 1511 (!) 136/56  ?   Pulse Rate 12/12/21 1508 (!) 41  ?   Resp 12/12/21 1508 19  ?   Temp  12/12/21 1508 97.6 ?F (36.4 ?C)  ?   Temp Source 12/12/21 1508 Oral  ?   SpO2 12/12/21 1508 93 %  ?   Weight 12/12/21 1513 131 lb (59.4 kg)  ?   Height 12/12/21 1513 5' (1.524 m)  ?   Head Circumference --   ?   Peak Flow --   ?   Pain Score --   ?   Pain Loc --   ?   Pain Edu? --   ?   Excl. in [place]? --   ? ? ?Most recent vital signs: ?Vitals:  ? 12/12/21 1630 12/12/21 1645  ?BP: (!) 133/28 107/62  ?Pulse: (!) 33 (!) 57  ?Resp: 12 15  ?Temp:    ?SpO2: 94% (!) 87%  ? ? ? ?General: Awake, no distress.  ?CV:  Good peripheral perfusion.  ?Resp:  Normal effort.  ?Abd:  No distention. Soft and non-tender ?Neuro:             Awake,  ?Other:   ? ? ?ED Results / Procedures / Treatments  ?Labs ?(all labs ordered are listed, but only abnormal results are displayed) ?Labs Reviewed  ?RESP PANEL BY RT-PCR (FLU A&B, COVID) ARPGX2 - Abnormal; Notable for the following components:  ?    Result Value  ? SARS Coronavirus 2  by RT PCR POSITIVE (*)   ? All other components within normal limits  ?COMPREHENSIVE METABOLIC PANEL - Abnormal; Notable for the following components:  ? Sodium 155 (*)   ? Potassium 5.7 (*)   ? Chloride 117 (*)   ? Glucose, Bld 102 (*)   ? Creatinine, Ser 2.30 (*)   ? Calcium 7.4 (*)   ? Total Protein 6.4 (*)   ? Albumin 3.3 (*)   ? GFR, Estimated 20 (*)   ? All other components within normal limits  ?CBC WITH DIFFERENTIAL/PLATELET - Abnormal; Notable for the following components:  ? Abs Immature Granulocytes 0.12 (*)   ? All other components within normal limits  ?T4, FREE - Abnormal; Notable for the following components:  ? Free T4 1.30 (*)   ? All other components within normal limits  ?URINALYSIS, COMPLETE (UACMP) WITH MICROSCOPIC - Abnormal; Notable for the following components:  ? Color, Urine AMBER (*)   ? APPearance CLOUDY (*)   ? Ketones, ur 5 (*)   ? Protein, ur 100 (*)   ? Leukocytes,Ua MODERATE (*)   ? WBC, UA >50 (*)   ? Bacteria, UA MANY (*)   ? All other components within normal limits  ?TROPONIN I  (HIGH SENSITIVITY) - Abnormal; Notable for the following components:  ? Troponin I (High Sensitivity) 28 (*)   ? All other components within normal limits  ?URINE CULTURE  ?TSH  ?MAGNESIUM  ?BUN  ?TROPONIN I (HIGH SENSITIVITY)  ? ? ? ?EKG ? ?EKG interpreted by myself, bradycardia, wide QRS, likely second-degree type II with variable block ? ? ?RADIOLOGY ?I reviewed the CT scan of the brain which does not show any acute intracranial process; agree with radiology report  ? ? ? ?PROCEDURES: ? ?Critical Care performed: Yes, see critical care procedure note(s) ? ?.1-3 Lead EKG Interpretation ?Performed by: Rada Hay, MD ?Authorized by: Rada Hay, MD  ? ?  Interpretation: abnormal   ?  ECG rate assessment: bradycardic   ?  Rhythm: sinus bradycardia   ?  Ectopy: none   ?  Conduction: abnormal   ?  Abnormal conduction: 2nd degree AV block ([place] 2)   ?Marland KitchenCritical Care ?Performed by: Rada Hay, MD ?Authorized by: Rada Hay, MD  ? ?Critical care provider statement:  ?  Critical care time (minutes):  45 ?  Critical care was time spent personally by me on the following activities:  Development of treatment plan with patient or surrogate, discussions with consultants, evaluation of patient's response to treatment, examination of patient, ordering and review of laboratory studies, ordering and review of radiographic studies, ordering and performing treatments and interventions, pulse oximetry, re-evaluation of patient's condition and review of old charts ? ?The patient is on the cardiac monitor to evaluate for evidence of arrhythmia and/or significant heart rate changes. ? ? ?MEDICATIONS ORDERED IN ED: ?Medications  ?dextrose 5 % solution (has no administration in time range)  ?cefTRIAXone (ROCEPHIN) 1 g in sodium chloride 0.9 % 100 mL IVPB (1 g Intravenous New Bag/Given 12/12/21 1636)  ?lactated ringers bolus 1,000 mL (1,000 mLs Intravenous New Bag/Given 12/12/21 1653)  ? ? ? ?IMPRESSION / MDM /  ASSESSMENT AND PLAN / ED COURSE  ?I reviewed the triage vital signs and the nursing notes. ?             ?               ? ?Differential diagnosis includes, but is  not limited to, traumatic brain injury, sepsis, cardiac ischemia, myxedema coma, electrolyte abnormality, AKI ? ?Patient is a 88 year old female with a history of underlying dementia who presents from her nursing facility with altered mental status.  She had COVID about a week ago has recovered from this is presenting with frequent falls and fatigue decreased activity.  Patient's granddaughter is at bedside notes that she significantly off her baseline normally is able to carry on a conversation although somewhat difficult to understand and is much more somnolent at this time.  Patient is notably bradycardic with heart rates in the mid to high 30s but normal blood pressure.  EKG does show some sort of high degree AV block suspect second-degree type II with variable block.  Does have a widened QRS with right bundle morphology reviewed prior EKG which also showed a right bundle.  Patient is not on any AV nodal blocking agents per my review.  She is pending labs.  Currently she is full code.  Her social worker is her legal guardian. ? ?Attempted to reach her guardian but it is after hours. I have a page out to on call SW. Spoke with patients granddaughter Shirley Villanueva [phone]) who is also her next of kin about the situation and she does express that she would want the patient to have a pacer placed if she needed.  ? ?Pts labs are otherwise notable for hypernatremia, AKI, hyperK to 5.7, trop of 28 which we will repeat. UA with significant pyruia, will send urine culture and treat with rocephin.  ? ?Clinical Course as of 12/12/21 1713  ?Mon Dec 12, 2021  ?1628 SARS Coronavirus 2 by RT PCR(!): POSITIVE [KM]  ?1640 Squamous Epithelial / LPF: 6-10 [KM]  ?1640 WBC Clumps: PRESENT [KM]  ?  ?Clinical Course User Index ?[KM] Rada Hay, MD   ? ? ? ?FINAL CLINICAL IMPRESSION(S) / ED DIAGNOSES  ? ?Final diagnoses:  ?Altered mental status, unspecified altered mental status type  ?AKI (acute kidney injury) ([place])  ?Heart block AV second degree  ?Hyperkalemia  ?

## 2021-12-12 NOTE — ED Notes (Signed)
Grandaughter remains at the bedside ?

## 2021-12-12 NOTE — ED Notes (Signed)
Md in with pt and family  pt placed on 3 liters oxygen.   ?

## 2021-12-12 NOTE — H&P (Signed)
?History and Physical  ? ? ?Shirley Villanueva ZOX:[id number] DOB: Jun 04, 1928 DOA: 12/12/2021 ? ?PCP: Vinnie Level, NP  ?Patient coming from: SNF ? ? ?Chief Complaint: AMS ? ?HPI: Shirley Villanueva is a 88 y.o. female with medical history significant for lewy body dementia, copd, who presents with the above. ? ?[name] is HCPOA. ? ?Baseline is alert and conversant but demented. Per granddaughter patient tested positive for covid on 3/10. Since then she has become less conversant, has taken to bed, and has not been eating or drinking much. No report of vomiting or diarrhea. There is report of a fall and she has a bruise on her forehead.  ? ?ED Course:  ? ?Covid positive. Labs show aki, hyperkalemia, hypernatremia. Found to be bradycardic, cardiology consulted ? ?Review of Systems: As per HPI otherwise 10 point review of systems negative.  ? ? ?Past Medical History:  ?Diagnosis Date  ? Aphasia   ? COPD (chronic obstructive pulmonary disease) (HCC)   ? Lack of coordination   ? Lewy body dementia (HCC)   ? ? ?History reviewed. No pertinent surgical history. ? ? reports that she has never smoked. She has never used smokeless tobacco. She reports that she does not drink alcohol and does not use drugs. ? ?No Known Allergies ? ?No family history on file. ? ?Prior to Admission medications   ?Medication Sig Start Date End Date Taking? Authorizing Provider  ?ALPRAZolam (XANAX) 0.25 MG tablet Take 0.25 mg by mouth 2 (two) times daily as needed. 12/08/21  Yes [provider]  ?amLODipine (NORVASC) 5 MG tablet Take 1 tablet (5 mg total) by mouth daily. 01/01/21  Yes Enedina Finner, MD  ?aspirin EC 81 MG tablet Take 1 tablet (81 mg total) by mouth daily. Swallow whole. 01/01/21 01/01/22 Yes Enedina Finner, MD  ?atorvastatin (LIPITOR) 20 MG tablet Take 20 mg by mouth daily. 08/18/19  Yes [provider]  ?calcium carbonate (OS-CAL - DOSED IN MG OF ELEMENTAL CALCIUM) 1250 (500 Ca) MG tablet Take 1 tablet by mouth.   Yes [provider]  ?divalproex (DEPAKOTE SPRINKLE) 125 MG capsule Take 250 mg by mouth 2 (two) times daily. 12/09/20  Yes [provider]  ?Melatonin 3 MG TABS Take 10 mg by mouth at bedtime.   Yes [provider]  ?Multiple Vitamins-Minerals (PRESERVISION AREDS 2) CAPS Take 1 capsule by mouth in the morning and at bedtime.   Yes [provider]  ?omeprazole (PRILOSEC) 20 MG capsule Take 20 mg by mouth daily. 08/18/19  Yes [provider]  ?PARoxetine (PAXIL) 30 MG tablet Take 30 mg by mouth daily. 08/18/19  Yes [provider]  ?traMADol (ULTRAM) 50 MG tablet Take 50 mg by mouth 2 (two) times daily as needed. 08/18/19  Yes [provider]  ?traZODone (DESYREL) 50 MG tablet Take 50 mg by mouth at bedtime. 08/08/19  Yes [provider]  ?ALPRAZolam Prudy Feeler) 0.5 MG tablet Take 0.5 mg by mouth 2 (two) times daily. ?Patient not taking: Reported on 12/12/2021 08/16/19   [provider]  ?Calcium Carbonate-Vitamin D3 600-400 MG-UNIT TABS Take 1 tablet by mouth at bedtime. ?Patient not taking: Reported on 12/12/2021 12/09/20   [provider]  ?multivitamin-lutein (OCUVITE-LUTEIN) CAPS capsule Take 1 capsule by mouth daily. ?Patient not taking: Reported on 12/12/2021    [provider]  ?senna-docusate (SENOKOT-S) 8.6-50 MG tablet Take 2 tablets by mouth daily. ?Patient not taking: Reported on 12/12/2021    [provider]  ? ? ?  Physical Exam: ?Vitals:  ? 12/12/21 1715 12/12/21 1720 12/12/21 1725 12/12/21 1730  ?BP:  (!) 141/39    ?Pulse: (!) 55 (!) 121 69 90  ?Resp: (!) 21   (!) 9  ?Temp:      ?TempSrc:      ?SpO2: 95%   100%  ?Weight:      ?Height:      ? ? ?Constitutional: No acute distress ?Head: Atraumatic ?Eyes: Conjunctiva clear ?ENM: Moist mucous membranes. Normal dentition.  ?Neck: Supple ?Respiratory: Clear to auscultation bilaterally, no wheezing/rales/rhonchi. Normal respiratory effort. No accessory muscle use.  Marland Kitchen ?Cardiovascular: Regular rate and rhythm. No murmurs/rubs/gallops. ?Abdomen: Non-tender, non-distended. No masses. No rebound or guarding. Positive bowel sounds. ?Musculoskeletal: No joint deformity upper and lower extremities. Normal ROM, no contractures. Normal muscle tone.  ?Skin: No rashes, lesions, or ulcers.  ?Extremities: No peripheral edema. Palpable peripheral pulses. ?Neurologic: Alert, moving all 4 extremities. ?Psychiatric: Normal insight and judgement. ? ? ?Labs on Admission: I have personally reviewed following labs and imaging studies ? ?CBC: ?Recent Labs  ?Lab 12/12/21 ?1518  ?WBC 10.5  ?NEUTROABS 7.5  ?HGB 12.7  ?HCT 42.1  ?MCV 94.6  ?PLT 350  ? ?Basic Metabolic Panel: ?Recent Labs  ?Lab 12/12/21 ?1518 12/12/21 ?1650  ?NA 155*  --   ?K 5.7*  --   ?CL 117*  --   ?CO2 26  --   ?GLUCOSE 102*  --   ?BUN QUANTITY NOT SUFFICIENT, UNABLE TO PERFORM TEST 97*  ?CREATININE 2.30*  --   ?CALCIUM 7.4*  --   ?MG 2.2  --   ? ?GFR: ?Estimated Creatinine Clearance: 12.9 mL/min (A) (by C-G formula based on SCr of 2.3 mg/dL (H)). ?Liver Function Tests: ?Recent Labs  ?Lab 12/12/21 ?1518  ?AST 25  ?ALT 18  ?ALKPHOS 65  ?BILITOT 0.7  ?PROT 6.4*  ?ALBUMIN 3.3*  ? ?No results for input(s): LIPASE, AMYLASE in the last 168 hours. ?No results for input(s): AMMONIA in the last 168 hours. ?Coagulation Profile: ?No results for input(s): INR, PROTIME in the last 168 hours. ?Cardiac Enzymes: ?No results for input(s): CKTOTAL, CKMB, CKMBINDEX, TROPONINI in the last 168 hours. ?BNP (last 3 results) ?No results for input(s): PROBNP in the last 8760 hours. ?HbA1C: ?No results for input(s): HGBA1C in the last 72 hours. ?CBG: ?No results for input(s): GLUCAP in the last 168 hours. ?Lipid Profile: ?No results for input(s): CHOL, HDL, LDLCALC, TRIG, CHOLHDL, LDLDIRECT in the last 72 hours. ?Thyroid Function Tests: ?Recent Labs  ?  12/12/21 ?1518  ?TSH 1.349  ?FREET4 1.30*  ? ?Anemia Panel: ?No results for input(s): VITAMINB12, FOLATE,  FERRITIN, TIBC, IRON, RETICCTPCT in the last 72 hours. ?Urine analysis: ?   ?Component Value Date/Time  ? COLORURINE AMBER (A) 12/12/2021 1519  ? APPEARANCEUR CLOUDY (A) 12/12/2021 1519  ? APPEARANCEUR Clear 05/26/2013 1604  ? LABSPEC 1.021 12/12/2021 1519  ? LABSPEC 1.017 05/26/2013 1604  ? PHURINE 5.0 12/12/2021 1519  ? GLUCOSEU NEGATIVE 12/12/2021 1519  ? GLUCOSEU Negative 05/26/2013 1604  ? HGBUR NEGATIVE 12/12/2021 1519  ? BILIRUBINUR NEGATIVE 12/12/2021 1519  ? BILIRUBINUR Negative 05/26/2013 1604  ? KETONESUR 5 (A) 12/12/2021 1519  ? PROTEINUR 100 (A) 12/12/2021 1519  ? NITRITE NEGATIVE 12/12/2021 1519  ? LEUKOCYTESUR MODERATE (A) 12/12/2021 1519  ? LEUKOCYTESUR Negative 05/26/2013 1604  ? ? ?Radiological Exams on Admission: ?CT HEAD WO CONTRAST ( ) ? ?Result Date: 12/12/2021 ?CLINICAL DATA:  Altered mental status EXAM: CT HEAD WITHOUT CONTRAST TECHNIQUE: Contiguous axial images  were obtained from the base of the skull through the vertex without intravenous contrast. RADIATION DOSE REDUCTION: This exam was performed according to the departmental dose-optimization program which includes automated exposure control, adjustment of the mA and/or kV according to patient size and/or use of iterative reconstruction technique. COMPARISON:  12/31/2020 FINDINGS: Brain: No acute intracranial findings are seen. There is dilation of third and both lateral ventricles. There is no shift of midline structures. Cortical sulci are prominent. Minimal calcifications are seen in the basal ganglia on the left side. Vascular: Scattered arterial calcifications are seen. Skull: No fracture is seen in the calvarium. Sinuses/Orbits: There is mild mucosal thickening in the ethmoid sinus. Other: None IMPRESSION: No acute intracranial findings are seen in noncontrast CT brain. Central and cortical atrophy. Electronically Signed   By: Ernie AvenaPalani  Rathinasamy M.D.   On: 12/12/2021 15:42  ? ?DG Chest Portable 1 View ? ?Result Date:  12/12/2021 ?CLINICAL DATA:  Chest pain. EXAM: PORTABLE CHEST 1 VIEW COMPARISON:  December 31, 2020. FINDINGS: The heart size and mediastinal contours are within normal limits. Both lungs are clear. The visualized skeletal structures ar

## 2021-12-12 NOTE — ED Triage Notes (Addendum)
Pt to ED via [facility] from [facility] for Altered Mental Status that started 3 days ago. Per EMS staff states she has gotten more confused over the past 3 days. Pt normally is ambulatory at facility, and has not been able to ambulate today. Pt had COVID 2 weeks ago,Retested Friday- negative result . Staff reports she fell over weekend.  ?Pt has hx of dementia.  ? ?

## 2021-12-12 NOTE — Progress Notes (Signed)
Civil engineer, contracting [facility]) Hospital Liaison Note ?  ?Received request from prior facility prior to patient admission for Hospice Services to follow @ SNF. MSW contacted Legal Guardian/[place] [name]/Kiley Marrow--[phone] to confirm interest and did not receive an answer. MSW left VM explaining purpose of call and contact information provided. MSW will attempt contact tomorrow. ?  ?Please do not hesitate to call with any hospice related questions.  ?  ?Thank you for the opportunity to participate in this patient's care. ?  ?Odette Fraction, MSW ?[facility] Hospital Liaison  ?[phone] ?

## 2021-12-12 NOTE — ED Notes (Signed)
Report received from Amy, RN.

## 2021-12-12 NOTE — ED Notes (Addendum)
Pt brought in via ems from [facility].    Pt with recent falls and covid 2 weeks ago.  Pt has red area to forehead.  Altered mental status.  Pt moans out.  Hx dementia.  Family with pt.  Iv in place.  brady on monitor.   ?

## 2021-12-12 NOTE — ED Notes (Signed)
Family tearful.  Chaplin paged to come visit with family.   ?

## 2021-12-12 NOTE — ED Notes (Signed)
Sitter remains at the bedside 

## 2021-12-12 NOTE — ED Notes (Signed)
Pt awake and talking  iv fluids infusing.   ?

## 2021-12-12 NOTE — ED Notes (Signed)
Sitter at the bedside. Pt continues to pull at IV lines, monitor cables, and Zoll patches. AC called regarding stocking supply of safety mitts. Awaiting transport to bring them. ?

## 2021-12-12 NOTE — ED Notes (Signed)
Pt awake and moving about on stretcher.  Cath ua to lab.  Pt placed on cardiac pads.  Family with pt.  2 iv's in place.    ?

## 2021-12-13 DIAGNOSIS — N179 Acute kidney failure, unspecified: Secondary | ICD-10-CM

## 2021-12-13 DIAGNOSIS — G3183 Dementia with Lewy bodies: Secondary | ICD-10-CM | POA: Diagnosis not present

## 2021-12-13 DIAGNOSIS — N189 Chronic kidney disease, unspecified: Secondary | ICD-10-CM

## 2021-12-13 DIAGNOSIS — E870 Hyperosmolality and hypernatremia: Secondary | ICD-10-CM | POA: Diagnosis not present

## 2021-12-13 DIAGNOSIS — E875 Hyperkalemia: Secondary | ICD-10-CM

## 2021-12-13 DIAGNOSIS — G9341 Metabolic encephalopathy: Secondary | ICD-10-CM | POA: Diagnosis not present

## 2021-12-13 DIAGNOSIS — R001 Bradycardia, unspecified: Secondary | ICD-10-CM | POA: Insufficient documentation

## 2021-12-13 DIAGNOSIS — J449 Chronic obstructive pulmonary disease, unspecified: Secondary | ICD-10-CM

## 2021-12-13 DIAGNOSIS — B370 Candidal stomatitis: Secondary | ICD-10-CM

## 2021-12-13 DIAGNOSIS — F0283 Dementia in other diseases classified elsewhere, unspecified severity, with mood disturbance: Secondary | ICD-10-CM

## 2021-12-13 LAB — BASIC METABOLIC PANEL
Anion gap: 10 (ref 5–15)
Anion gap: 8 (ref 5–15)
Anion gap: 8 (ref 5–15)
BUN: 86 mg/dL — ABNORMAL HIGH (ref 8–23)
BUN: 88 mg/dL — ABNORMAL HIGH (ref 8–23)
BUN: 88 mg/dL — ABNORMAL HIGH (ref 8–23)
CO2: 28 mmol/L (ref 22–32)
CO2: 30 mmol/L (ref 22–32)
CO2: 31 mmol/L (ref 22–32)
Calcium: 8.7 mg/dL — ABNORMAL LOW (ref 8.9–10.3)
Calcium: 9.2 mg/dL (ref 8.9–10.3)
Calcium: 9.3 mg/dL (ref 8.9–10.3)
Chloride: 112 mmol/L — ABNORMAL HIGH (ref 98–111)
Chloride: 114 mmol/L — ABNORMAL HIGH (ref 98–111)
Chloride: 115 mmol/L — ABNORMAL HIGH (ref 98–111)
Creatinine, Ser: 1.91 mg/dL — ABNORMAL HIGH (ref 0.44–1.00)
Creatinine, Ser: 2.05 mg/dL — ABNORMAL HIGH (ref 0.44–1.00)
Creatinine, Ser: 2.05 mg/dL — ABNORMAL HIGH (ref 0.44–1.00)
GFR, Estimated: 22 mL/min — ABNORMAL LOW (ref >60)
GFR, Estimated: 22 mL/min — ABNORMAL LOW (ref >60)
GFR, Estimated: 24 mL/min — ABNORMAL LOW (ref >60)
Glucose, Bld: 118 mg/dL — ABNORMAL HIGH (ref 70–99)
Glucose, Bld: 119 mg/dL — ABNORMAL HIGH (ref 70–99)
Glucose, Bld: 173 mg/dL — ABNORMAL HIGH (ref 70–99)
Potassium: 4.7 mmol/L (ref 3.5–5.1)
Potassium: 4.7 mmol/L (ref 3.5–5.1)
Potassium: 5 mmol/L (ref 3.5–5.1)
Sodium: 148 mmol/L — ABNORMAL HIGH (ref 135–145)
Sodium: 153 mmol/L — ABNORMAL HIGH (ref 135–145)
Sodium: 155 mmol/L — ABNORMAL HIGH (ref 135–145)

## 2021-12-13 MED ORDER — FLUCONAZOLE IN SODIUM CHLORIDE 200-0.9 MG/100ML-% IV SOLN
100.0000 mg | INTRAVENOUS | Status: DC
  Administered 2021-12-13: 200 mg via INTRAVENOUS
  Administered 2021-12-14: 100 mg via INTRAVENOUS
  Filled 2021-12-13 (×2): qty 100
  Filled 2021-12-13 (×2): qty 50

## 2021-12-13 MED ORDER — ALPRAZOLAM 0.25 MG PO TABS
0.2500 mg | ORAL_TABLET | Freq: Once | ORAL | Status: AC
  Administered 2021-12-13: 0.25 mg via ORAL
  Filled 2021-12-13: qty 1

## 2021-12-13 MED ORDER — DEXTROSE 5 % IV SOLN: INTRAVENOUS | Status: DC

## 2021-12-13 MED ORDER — SODIUM CHLORIDE 0.9 % IV SOLN
1.0000 g | INTRAVENOUS | Status: AC
  Administered 2021-12-13 – 2021-12-15 (×3): 1 g via INTRAVENOUS
  Filled 2021-12-13 (×3): qty 1

## 2021-12-13 MED ORDER — PAROXETINE HCL 30 MG PO TABS
30.0000 mg | ORAL_TABLET | Freq: Every day | ORAL | Status: DC
  Administered 2021-12-13 – 2021-12-16 (×4): 30 mg via ORAL
  Filled 2021-12-13 (×4): qty 1

## 2021-12-13 MED ORDER — DIVALPROEX SODIUM 125 MG PO CSDR
250.0000 mg | DELAYED_RELEASE_CAPSULE | Freq: Two times a day (BID) | ORAL | Status: DC
  Administered 2021-12-13 – 2021-12-16 (×6): 250 mg via ORAL
  Filled 2021-12-13 (×6): qty 2

## 2021-12-13 NOTE — ED Notes (Signed)
Pt resting at this time, mits on hands. Sitter at bedside. Pt HR 30-40 2nd degree block, Mds already aware.  ?

## 2021-12-13 NOTE — Evaluation (Addendum)
Clinical/Bedside Swallow Evaluation ?Patient Details  ?Name: Shirley Villanueva ?MRN: [id number] ?Date of Birth: October 13, 1927 ? ?Today's Date: 12/13/2021 ?Time: SLP Start Time (ACUTE ONLY): [id number] SLP Stop Time (ACUTE ONLY): [id number] ?SLP Time Calculation (min) (ACUTE ONLY): 60 min ? ?Past Medical History:  ?Past Medical History:  ?Diagnosis Date  ? Aphasia   ? COPD (chronic obstructive pulmonary disease) ([place])   ? Lack of coordination   ? Lewy body dementia ([place])   ? ?Past Surgical History: History reviewed. No pertinent surgical history. ?HPI:  ?Pt is a 88 y.o. female with medical history significant for Lewy Body Dementia, severe cognitive-linguistic deficits consistent with baseline function/ Lewy body dementia per cog-linguistic assessment in 2022 during admit, COPD, total care for ADLs at her facility.  She was COVID+ about a week ago and since that time, she has somewhat declined has been more weak and has had several falls per chart notes at admit.  Her legal guardian is [name], Patient is being evaluated for Hospice at her facility per chart note.    ? ?CXR: No active disease; Head CT: no acute change; chronic changes noted.  ? ?  ?Assessment / Plan / Recommendation  ?Clinical Impression ? Pt was seen for BSE this morning. She was constantly moving about in bed; mumbled, tangential speech most of which was unintelligible in meaning; poor comprehension and no follow through w/ tasks given MAX cues. She has a Baseline of Lewy Body Dementia. She responded to oral stimulation most trials(spoon, straw at lips) and turned her head to open mouth. MAX verbal/tactile/visual cues given during po tasks. Pt had bilateral mitts in place; Sitter in room.  ? ?Pt appears to present w/ oropharyngeal phase dysphagia in setting of significantly declined Cognitive status; Baseline Lewy Body Dementia. She requires MAX verbal/tactile/visual cues for all follow through w/ po tasks. This can impact her overall awareness/timing of swallow and safety  during po tasks which increases risk for aspiration, choking. Pt's risk for aspiration is present but can be reduced when following aspiration precautions and using a modified diet consistency w/ Nectar liquids.  ? ?It was difficult for pt to maintain an upright sitting position for oral intake d/t constant body/motor movements; support given by Sitter and this Clinician to sit upright. Pt consumed several trials of purees and Nectar liquids(via Pinched straw) w/ No overt clinical s/s of aspiration noted; no decline in vocal quality, no cough, and no decline in respiratory status during/post trials. O2 sats remained 100%. Delayed and immediate throat clearing were noted w/ ~5/8 trials of thin liquids(via Pinched straw) -- suspect related to pharyngeal swallow w/ decreased airway closure/protection and poor awareness of thin liquid consistency during the swallow -- the impact of the Dementia on swallowing. ?Oral phase was grossly adequate for general bolus management and oral clearing of the bolus consistencies given. She exhibited intermittent delay in A-P transfer of trials but not others -- poor awareness d/t the Dementia. Time was needed b/t trials. Noted "chewing behavior" when pt was not presented a bolus. Pt is Edentulous baseline. No self-feeding was attempted d/t Cognitive status. OM Exam was cursory w/ no unilateral weakness noted. Pt could not follow through w/ OM tasks and oral care.  OF NOTE: a mild, white coating on pt's tongue was noted. MD made aware.  ? ?D/t pt's Baseline, declined Cognitive status w/ Lewy Body Dementia and her risk for aspiration, recommend initiation of the dysphagia level 1(purees) w/ Nectar liquids via Pinched straw d/t impulsive drinking behavior;  aspiration precautions; reduce Distractions during meals and engage pt during po's at meals as able. Pills Crushed in Puree for safer swallowing. Support feeding at meals. MD/NSG updated.  ? ?ST services recommends follow w/ Palliative  Care for [place]; noted recent initiation of Hospice services at her Facility per chart notes. Discussion w/ Family/staff re: impact of Cognitive decline/Dementia on swallowing and ability to maintain nutritional needs. ST services can follow pt at discharge at her Facility for further needs/education as needed. This dysphagia diet consistency appears most beneficial to help ensure increased safety w/ oral intake, and oral intake in general to meet nutrition/hydration needs. Largely suspect that pt's Lewy Body Dementia could hamper upgrade of diet. Precautions posted in room. Updated MD/NSG.  ?SLP Visit Diagnosis: Dysphagia, oropharyngeal phase (R13.12) (baseline Lewy Body Dementia) ?   ?Aspiration Risk ? Moderate aspiration risk;Risk for inadequate nutrition/hydration  ?  ?Diet Recommendation   dysphagia level 1(purees) w/ Nectar liquids via Pinched straw d/t impulsive drinking behavior; aspiration precautions; reduce Distractions during meals and engage pt during po's at meals as able. Support feeding at meals giving oral stimulation/cues during presentation of boluses.  ? ?Medication Administration: Crushed with puree  ?  ?Other  Recommendations Recommended Consults:  (Dietician f/u; Palliative Care f/u) ?Oral Care Recommendations: Oral care BID;Oral care before and after PO;Staff/trained caregiver to provide oral care ?Other Recommendations: Order thickener from pharmacy;Prohibited food (jello, ice cream, thin soups);Remove water pitcher;Have oral suction available   ? ?Recommendations for follow up therapy are one component of a multi-disciplinary discharge planning process, led by the attending physician.  Recommendations may be updated based on patient status, additional functional criteria and insurance authorization. ? ?Follow up Recommendations Follow physician's recommendations for discharge plan and follow up therapies  ? ? ?  ?Assistance Recommended at Discharge Frequent or constant Supervision/Assistance   ?Functional Status Assessment Patient has had a recent decline in their functional status and/or demonstrates limited ability to make significant improvements in function in a reasonable and predictable amount of time  ?Frequency and Duration  (n/a)  ? (n/a) ?  ?   ? ?Prognosis Prognosis for Safe Diet Advancement: Guarded (-Fair) ?Barriers to Reach Goals: Cognitive deficits;Language deficits;Time post onset;Severity of deficits;Behavior ?Barriers/Prognosis Comment: LB Dementia  ? ?  ? ?Swallow Study   ?General Date of Onset: 12/12/21 ?HPI: Pt is a 88 y.o. female with medical history significant for Lewy Body Dementia, severe cognitive-linguistic deficits consistent with baseline function/ Lewy body dementia per cog-linguistic assessment in 2022 during admit, COPD, total care for ADLs at her facility.  She was COVID+ about a week ago and since that time, she has somewhat declined has been more weak and has had several falls per chart notes at admit.  Her legal guardian is [name], Patient is being evaluated for Hospice at her facility per chart note.   CXR: No active disease; Head CT: no acute change; chronic changes noted. ?Type of Study: Bedside Swallow Evaluation ?Previous Swallow Assessment: none ?Diet Prior to this Study: NPO ?Temperature Spikes Noted: No (wbc 10.5) ?Respiratory Status: Nasal cannula (2L) ?History of Recent Intubation: No ?Behavior/Cognition: Alert;Cooperative;Pleasant mood;Confused;Requires cueing;Doesn't follow directions;Distractible (constant motor/body movements) ?Oral Cavity Assessment:  (limited -- noted white coating on tongue) ?Oral Care Completed by SLP:  (attempted) ?Oral Cavity - Dentition: Edentulous ?Vision:  (n/a) ?Self-Feeding Abilities: Total assist ?Patient Positioning: Postural control adequate for testing;Upright in bed (constant motor/body movements) ?Baseline Vocal Quality: Normal ?Volitional Cough: Cognitively unable to elicit ?Volitional Swallow: Unable to elicit  ?   ?  Oral/Motor/Sensory Function Overall Oral Motor/Sensory Function:  (appeared WFL during po tasks -- no unilateral weakness noted)   ?Ice Chips Ice chips: Not tested   ?Thin Liquid Thin Liquid: Impaired ?Pre

## 2021-12-13 NOTE — Progress Notes (Signed)
?Progress Note ? ? ?Patient: Shirley Villanueva [id number] DOB: 1928/07/20 DOA: 12/12/2021     1 ?DOS: the patient was seen and examined on 12/13/2021 ?  ?Brief hospital course: ?88 year old female with history of Lewy body dementia, COPD sent in for altered mental status.  Was diagnosed with COVID on 12/02/2021 and has not been eating or drinking very much.  The patient does have a bruise on her forehead secondary to a fall.  Patient was admitted with bradycardia, hypernatremia, hyperkalemia, UTI, and given steroids for COVID infection. ? ?Assessment and Plan: ?* Acute metabolic encephalopathy ?The patient does have a history of Lewy body dementia.  Restart Paxil, Xanax and Depakote sprinkles. ? ?Lewy body dementia ([place]) ?We will get palliative care consultation for goals of care conversation but [name] is the guardian.  I left 3 messages with [name] without a phone call back at this point. ? ?Hypernatremia ?Sodium 155 on presentation down to 153.  Continue D5W. Overall prognosis poor with hypernatremia. ? ?Hyperkalemia ?This was treated in the emergency room yesterday.  Potassium 5.0 today. ? ?Sinus bradycardia ?Reviewed EKG that showed sinus bradycardia 37 bpm.  Cardiology following.  Unable to contact guardian at this point. ? ?COVID-19 virus infection ?Started on steroids by admitting physician.  We will continue for right now.  Initial diagnosis on 12/02/2021 as per admitting doctor ? ?Acute kidney injury superimposed on chronic kidney disease ([place]) ?Acute kidney injury on chronic kidney disease stage III.  Creatinine 2.3 on presentation down to 2.05.  Continue hydration with D5W. ? ?COPD (chronic obstructive pulmonary disease) ([place]) ?Respiratory status seems stable at this point ? ?Thrush ?We will give IV Diflucan for now. ? ? ? ? ?  ? ?Subjective: Patient sat up and was talking with me but difficult to understand.  Sent in for altered mental status and found to have acute kidney injury, hyperkalemia, hyponatremia  bradycardia and COVID-19 infection. ? ?Physical Exam: ?Vitals:  ? 12/13/21 0700 12/13/21 0750 12/13/21 1003 12/13/21 1100  ?BP: (!) 127/58 (!) 151/50 131/87 122/69  ?Pulse: (!) 38 94 79 91  ?Resp: 15 (!) 21 19   ?Temp:      ?TempSrc:      ?SpO2: 95% 95% 95% 96%  ?Weight:      ?Height:      ? ?Physical Exam ?HENT:  ?   Head: Normocephalic.  ?   Mouth/Throat:  ?   Comments: Thrush on tongue ?Eyes:  ?   General: Lids are normal.  ?   Conjunctiva/sclera: Conjunctivae normal.  ?Cardiovascular:  ?   Rate and Rhythm: Regular rhythm. Bradycardia present.  ?   Heart sounds: Normal heart sounds, S1 normal and S2 normal.  ?Pulmonary:  ?   Breath sounds: Examination of the right-lower field reveals decreased breath sounds. Examination of the left-lower field reveals decreased breath sounds. Decreased breath sounds present. No wheezing, rhonchi or rales.  ?Abdominal:  ?   Palpations: Abdomen is soft.  ?   Tenderness: There is no abdominal tenderness.  ?Musculoskeletal:  ?   Right lower leg: No swelling.  ?   Left lower leg: No swelling.  ?Skin: ?   General: Skin is warm.  ?   Findings: No rash.  ?Neurological:  ?   Mental Status: She is alert and oriented to person, place, and time.  ?  ?Data Reviewed: ?EKG reviewed by me.  Laboratory and radiological data also reviewed, sodium up at 153, creatinine 2.05, potassium 5.0 ? ? ?Family Communication: Spoke with  Earl Lagos [phone].  She does have another phone number at [phone]. ? ?Disposition: ?Status is: Inpatient ?Remains inpatient appropriate because: Treating for multiple medical issues including bradycardia COVID-19 infection, hyponatremia, acute kidney injury on CKD and hyperkalemia. ? ?Planned Discharge Destination: Home ? ?Author: ?Loletha Grayer, MD ?12/13/2021 11:48 AM ? ?For on call review [email or web address].  ?

## 2021-12-13 NOTE — ED Notes (Signed)
Patient was cleaned up and bedding was changed. [facility] [place] assisted. ?

## 2021-12-13 NOTE — Assessment & Plan Note (Addendum)
Sodium level has normalized. ? ?

## 2021-12-13 NOTE — ED Notes (Signed)
Pt's bed alarm and STAT alarm for telesitter begins to alarm. When walking into pt's room, she has turned around on the stretcher with her feet at the head of the bed and her head at the foot of the bed. She has pulled out another IV, ECG leads, pulse ox and BP cuff. Charge RN called for assistance and advised that for pt safety, a physical sitter needs to be in the room. Telesitter contacted and d/c'd. Paige, EDT at the bedside. New IV started, Fluids restarted, pt's brief and gown changed, and all monitoring equipment placed back on patient. Warm blankets provided and lights turned down to decrease stimulation. Hand mitts reapplied and secured, circulation checked and noted intact.  ?

## 2021-12-13 NOTE — ED Notes (Signed)
Patient has been resting in bed at this time. Patient has been corporative and redirectable.  ?

## 2021-12-13 NOTE — ED Notes (Signed)
Telesitter placed at the bedside.  ?

## 2021-12-13 NOTE — ED Notes (Signed)
Spoke with Idalia Needle, pt's grandaughter, and provided an update. States she works over at the [employer] and will be by around 2 this afternoon to see her grandmother. Requests to be called if/when pt gets a bed. Will give this message to the oncoming RN. ?

## 2021-12-13 NOTE — ED Notes (Signed)
Informed RN bed assigned 

## 2021-12-13 NOTE — Progress Notes (Signed)
Manufacturing engineer [facility]) Hospital Liaison Note ?  ?Received request from LTC facility prior to patient admission to [facility] for Hospice Services to follow @ SNF. MSW contacted Legal Guardian/[place] [name]/Kiley Marrow--[phone] & [place] [phone] to confirm interest and did not receive an answer. MSW left VM explaining purpose of call and contact information provided. MSW will await response from [name].  ? ?Ms. Stobbs is not active with [facility] nor is patient under review for services. [facility] will need [name] permission before patient can be reviewed for services. ?  ?Please do not hesitate to call with any hospice related questions.  ?  ?Thank you for the opportunity to participate in this patient's care. ?  ?Daphene Calamity, MSW ?[place]  ?[phone] ?

## 2021-12-13 NOTE — ED Notes (Signed)
1:1 safety sitter at bedside 

## 2021-12-13 NOTE — Assessment & Plan Note (Addendum)
Asymptomatic, poor prognosis.  No additional work-up is needed. ?

## 2021-12-13 NOTE — Assessment & Plan Note (Addendum)
Poor prognosis.  Currently pending decision from [name] for hospice. ?Resume home medicines. ?

## 2021-12-13 NOTE — Assessment & Plan Note (Addendum)
Continue to finish 7-day course with oral Diflucan. ?

## 2021-12-13 NOTE — Assessment & Plan Note (Addendum)
Improved

## 2021-12-13 NOTE — Progress Notes (Signed)
Admission profile updated. ?

## 2021-12-13 NOTE — ED Notes (Signed)
Pt wet with urine. Bedsheets and brief changed. New chux applied. Purewick readjusted in place.  ?

## 2021-12-13 NOTE — Assessment & Plan Note (Addendum)
Patient is less agitated 

## 2021-12-13 NOTE — ED Notes (Signed)
Sitting with patient at this time. 

## 2021-12-13 NOTE — Consult Note (Signed)
?[facility] CONSULT NOTE  ? ?    ?Patient ID: ?Shirley Villanueva ?MRN: [id number] ?DOB/AGE: Nov 23, 1927 88 y.o. ? ?Admit date: 12/12/2021 ?Referring Physician Dr. Leslye Peer ?Primary Physician  ?Primary Cardiologist  ?Reason for Consultation  bradycardia ? ?HPI: The patient is a 88 year old female with a past medical history of Lewy body dementia, COPD, CKD, chronic pain on tramadol positive COVID test 12/02/2021,who presented to [facility] ED 12/12/2021 from her skilled nursing facility with acute encephalopathy.  Cardiology is consulted because of bradycardia and 2nd degree heart block.  ? ?Per chart review, the patient tested positive for COVID-19 on 3/10 and ever since she has been less conversant, mostly staying in bed and not eating or drinking much and there was some concern for falls at her nursing facility.  Her baseline is alert and conversant but not oriented.  Her legal guardian is [name], multiple attempts at contact have been made but unsuccessful at this time.  She is not noted to be on any AV blocking agents, home Depakote, trazodone, Paxil, Xanax, tramadol held initially.  ? ?Labs on admission are notable for hypernatremia with sodium of 153, initial hyperkalemia 5.7, improved after calcium gluconate x1.  BUN 88, creatinine 2.05, EGFR 22 troponin x2 28-29. ? ?Vitals are notable for a blood pressure of 122/69 during interview, heart rate on telemetry 39 bpm.  ? ?Review of systems complete and found to be negative unless listed above  ? ? ? ?Past Medical History:  ?Diagnosis Date  ? Aphasia   ? COPD (chronic obstructive pulmonary disease) ([place])   ? Lack of coordination   ? Lewy body dementia ([place])   ?  ?History reviewed. No pertinent surgical history.  ?(Not in a hospital admission) ? ?Social History  ? ?Socioeconomic History  ? Marital status: Widowed  ?  Spouse name: Not on file  ? Number of children: Not on file  ? Years of education: Not on file  ? Highest education level: Not on file  ?Occupational  History  ? Not on file  ?Tobacco Use  ? Smoking status: Never  ? Smokeless tobacco: Never  ?Substance and Sexual Activity  ? Alcohol use: Never  ? Drug use: Never  ? Sexual activity: Not Currently  ?Other Topics Concern  ? Not on file  ?Social History Narrative  ? Not on file  ? ?Social Determinants of Health  ? ?Financial Resource Strain: Not on file  ?Food Insecurity: Not on file  ?Transportation Needs: Not on file  ?Physical Activity: Not on file  ?Stress: Not on file  ?Social Connections: Not on file  ?Intimate Partner Violence: Not on file  ?  ?No family history on file.  ? ? ?Review of systems complete and found to be negative unless listed above  ? ? ?PHYSICAL EXAM ?General: elderly and frail caucasian female in no acute distress, sleeping on her left side in ED stretcher ?HEENT:  Normocephalic and atraumatic. ?Neck:  No JVD.  ?Lungs: Normal respiratory effort on room air. Decreased breath sounds ?Heart: bradycardic RRR . Normal S1 and S2 without gallops or murmurs. Radial & DP pulses 2+ bilaterally. ?Abdomen: Non-distended appearing.  ?Msk: Normal strength and tone for age. ?Extremities: Warm and well perfused. No clubbing, cyanosis.  ?Neuro: sleeping.  ?Psych:  unable to assess ? ?Labs: ?  ?Lab Results  ?Component Value Date  ? WBC 10.5 12/12/2021  ? HGB 12.7 12/12/2021  ? HCT 42.1 12/12/2021  ? MCV 94.6 12/12/2021  ? PLT 350 12/12/2021  ?  ?  Recent Labs  ?Lab 12/12/21 ?1518 12/12/21 ?1650 12/13/21 ?0606  ?NA 155*   < > 153*  ?K 5.7*   < > 5.0  ?CL 117*   < > 115*  ?CO2 26   < > 30  ?BUN QUANTITY NOT SUFFICIENT, UNABLE TO PERFORM TEST   < > 88*  ?CREATININE 2.30*   < > 2.05*  ?CALCIUM 7.4*   < > 9.3  ?PROT 6.4*  --   --   ?BILITOT 0.7  --   --   ?ALKPHOS 65  --   --   ?ALT 18  --   --   ?AST 25  --   --   ?GLUCOSE 102*   < > 173*  ? < > = values in this interval not displayed.  ? ?Lab Results  ?Component Value Date  ? CKTOTAL 402 (H) 08/31/2019  ? TROPONINI < 0.02 10/09/2012  ?  ?Lab Results  ?Component  Value Date  ? CHOL 122 01/01/2021  ? CHOL 100 09/01/2019  ? ?Lab Results  ?Component Value Date  ? HDL 53 01/01/2021  ? HDL 45 09/01/2019  ? ?Lab Results  ?Component Value Date  ? Max[place]ass 50 01/01/2021  ? [place] 42 09/01/2019  ? ?Lab Results  ?Component Value Date  ? TRIG 93 01/01/2021  ? TRIG 65 09/01/2019  ? ?Lab Results  ?Component Value Date  ? CHOLHDL 2.3 01/01/2021  ? CHOLHDL 2.2 09/01/2019  ? ?No results found for: LDLDIRECT  ?  ?Radiology: CT HEAD WO CONTRAST (5MM) ? ?Result Date: 12/12/2021 ?CLINICAL DATA:  Altered mental status EXAM: CT HEAD WITHOUT CONTRAST TECHNIQUE: Contiguous axial images were obtained from the base of the skull through the vertex without intravenous contrast. RADIATION DOSE REDUCTION: This exam was performed according to the departmental dose-optimization program which includes automated exposure control, adjustment of the mA and/or kV according to patient size and/or use of iterative reconstruction technique. COMPARISON:  12/31/2020 FINDINGS: Brain: No acute intracranial findings are seen. There is dilation of third and both lateral ventricles. There is no shift of midline structures. Cortical sulci are prominent. Minimal calcifications are seen in the basal ganglia on the left side. Vascular: Scattered arterial calcifications are seen. Skull: No fracture is seen in the calvarium. Sinuses/Orbits: There is mild mucosal thickening in the ethmoid sinus. Other: None IMPRESSION: No acute intracranial findings are seen in noncontrast CT brain. Central and cortical atrophy. Electronically Signed   By: Elmer Picker M.D.   On: 12/12/2021 15:42  ? ?DG Chest Portable 1 View ? ?Result Date: 12/12/2021 ?CLINICAL DATA:  Chest pain. EXAM: PORTABLE CHEST 1 VIEW COMPARISON:  December 31, 2020. FINDINGS: The heart size and mediastinal contours are within normal limits. Both lungs are clear. The visualized skeletal structures are unremarkable. IMPRESSION: No active disease. Electronically Signed    By: Marijo Conception M.D.   On: 12/12/2021 15:39   ? ?ECHO 09/01/2019 ? 1. Left ventricular ejection fraction, by visual estimation, is 55 to  ?60%. The left ventricle has normal function. There is no left ventricular  ?hypertrophy.  ? 2. Left ventricular diastolic parameters are consistent with Grade II  ?diastolic dysfunction (pseudonormalization).  ? 3. Global right ventricle has normal systolic function.The right  ?ventricular size is normal. No increase in right ventricular wall  ?thickness.  ? 4. Left atrial size was normal.  ? 5. Right atrial size was normal.  ? 6. The mitral valve is normal in structure. Trace mitral valve  ?regurgitation. No  evidence of mitral stenosis.  ? 7. The tricuspid valve is normal in structure. Tricuspid valve  ?regurgitation is mild.  ? 8. The aortic valve is normal in structure. Aortic valve regurgitation is  ?not visualized. Mild to moderate aortic valve sclerosis/calcification  ?without any evidence of aortic stenosis.  ? 9. The pulmonic valve was normal in structure. Pulmonic valve  ?regurgitation is trivial.  ?10. Moderately elevated pulmonary artery systolic pressure.  ?11. The inferior vena cava is normal in size with greater than 50%  ?respiratory variability, suggesting right atrial pressure of 3 mmHg.  ? ?TELEMETRY reviewed by me: significant artifact but mostly HR in the high 30s ? ?EKG reviewed by me: second degree AV block rate 39 ? ?ASSESSMENT AND PLAN:  ?The patient is a 88 year old female with a past medical history of Lewy body dementia, COPD, CKD, chronic pain on tramadol positive COVID test 12/02/2021,who presented to [facility] ED 12/12/2021 from her skilled nursing facility with acute encephalopathy.  Cardiology is consulted because of bradycardia and 2nd degree heart block.  ? ?#bradycardia ?#2nd degree AV block ?#acute encephalopathy / lewy body dementia  ?#COVID19 ?The patient presented from her SNF with reported decreased oral intake, concern for recent falls and  was found to be bradycardic with heart rate in the high 30s with a second-degree heart block. She is reportedly alert and conversant at baseline but has declined significantly since testing positive for COVID

## 2021-12-13 NOTE — Assessment & Plan Note (Addendum)
No bronchospasm. ?

## 2021-12-13 NOTE — Hospital Course (Addendum)
88 year old female with history of Lewy body dementia, COPD sent in for altered mental status.  Was diagnosed with COVID on 12/02/2021 and has not been eating or drinking very much.  The patient does have a bruise on her forehead secondary to a fall.  Patient was admitted with bradycardia, hypernatremia, hyperkalemia, UTI, and given steroids for COVID infection. ? ?Patient is treated for COPD exacerbation and UTI.  She has a very poor prognosis, currently pending [name] approval for hospice.   ?

## 2021-12-13 NOTE — Progress Notes (Signed)
Spoke with granddaughter [place] Paige Michael on the phone.  She would like to be updated with any changes so she can come and visit.

## 2021-12-13 NOTE — Assessment & Plan Note (Addendum)
Renal function back to baseline 

## 2021-12-13 NOTE — Assessment & Plan Note (Addendum)
Continue steroids

## 2021-12-13 NOTE — ED Notes (Signed)
Pt consumed 75% lunch meal.  ?

## 2021-12-13 NOTE — ED Notes (Signed)
This tech and Mel,EDT provided peri care. Dry brief applied.  ?

## 2021-12-14 ENCOUNTER — Other Ambulatory Visit: Payer: Self-pay

## 2021-12-14 ENCOUNTER — Encounter: Payer: Self-pay | Admitting: Obstetrics and Gynecology

## 2021-12-14 DIAGNOSIS — R627 Adult failure to thrive: Secondary | ICD-10-CM | POA: Diagnosis not present

## 2021-12-14 DIAGNOSIS — G9341 Metabolic encephalopathy: Secondary | ICD-10-CM | POA: Diagnosis not present

## 2021-12-14 DIAGNOSIS — N390 Urinary tract infection, site not specified: Secondary | ICD-10-CM

## 2021-12-14 DIAGNOSIS — G3183 Dementia with Lewy bodies: Secondary | ICD-10-CM | POA: Diagnosis not present

## 2021-12-14 DIAGNOSIS — B9620 Unspecified Escherichia coli [E. coli] as the cause of diseases classified elsewhere: Secondary | ICD-10-CM

## 2021-12-14 LAB — BASIC METABOLIC PANEL
Anion gap: 5 (ref 5–15)
Anion gap: 7 (ref 5–15)
BUN: 75 mg/dL — ABNORMAL HIGH (ref 8–23)
BUN: 78 mg/dL — ABNORMAL HIGH (ref 8–23)
CO2: 29 mmol/L (ref 22–32)
CO2: 31 mmol/L (ref 22–32)
Calcium: 8.6 mg/dL — ABNORMAL LOW (ref 8.9–10.3)
Calcium: 8.6 mg/dL — ABNORMAL LOW (ref 8.9–10.3)
Chloride: 108 mmol/L (ref 98–111)
Chloride: 111 mmol/L (ref 98–111)
Creatinine, Ser: 1.46 mg/dL — ABNORMAL HIGH (ref 0.44–1.00)
Creatinine, Ser: 1.66 mg/dL — ABNORMAL HIGH (ref 0.44–1.00)
GFR, Estimated: 29 mL/min — ABNORMAL LOW (ref >60)
GFR, Estimated: 34 mL/min — ABNORMAL LOW (ref >60)
Glucose, Bld: 134 mg/dL — ABNORMAL HIGH (ref 70–99)
Glucose, Bld: 168 mg/dL — ABNORMAL HIGH (ref 70–99)
Potassium: 4.4 mmol/L (ref 3.5–5.1)
Potassium: 4.8 mmol/L (ref 3.5–5.1)
Sodium: 144 mmol/L (ref 135–145)
Sodium: 147 mmol/L — ABNORMAL HIGH (ref 135–145)

## 2021-12-14 MED ORDER — FLUCONAZOLE NICU/PED ORAL SYRINGE 10 MG/ML
100.0000 mg | ORAL | Status: DC
  Administered 2021-12-15: 100 mg via ORAL
  Filled 2021-12-14 (×2): qty 10

## 2021-12-14 NOTE — Assessment & Plan Note (Addendum)
Completed antibiotics 

## 2021-12-14 NOTE — TOC Progression Note (Signed)
Transition of Care (TOC) - Progression Note  ? ? ?Patient Details  ?Name: Shirley Villanueva ?MRN: [id number] ?Date of Birth: [birth year] ? ?Transition of Care (TOC) CM/SW Contact  ?Alberteen Sam, LCSW ?Phone Number: ?12/14/2021, 3:20 PM ? ?Clinical Narrative:    ? ?Earl Lagos returned CSW's call from [phone].  ? ?She confirms [name] is guardian of patient and she reports she will reach out to [place] with [facility] to further discuss hospice referral for when patient returns to [facility].  ? ? ?Expected Discharge Plan:  (TBD) ?Barriers to Discharge: Continued Medical Work up ? ?Expected Discharge Plan and Services ?Expected Discharge Plan:  (TBD) ?  ?  ?  ?  ?                ?  ?  ?  ?  ?  ?  ?  ?  ?  ?  ? ? ?Social Determinants of Health (SDOH) Interventions ?  ? ?Readmission Risk Interventions ?   ? View : No data to display.  ?  ?  ?  ? ? ?

## 2021-12-14 NOTE — TOC Initial Note (Signed)
Transition of Care (TOC) - Initial/Assessment Note  ? ? ?Patient Details  ?Name: Shirley Villanueva ?MRN: [id number] ?Date of Birth: Jul 24, 1928 ? ?Transition of Care (TOC) CM/SW Contact:    ?Alberteen Sam, LCSW ?Phone Number: ?12/14/2021, 8:46 AM ? ?Clinical Narrative:                 ? ?CSW notes patient is from [facility].  ? ?Per chart history it is noted patient has guardianship from [name].  ? ?CSW has called to confirm this status, pending call backs at this time.  ? ?CSW has called Mertha Finders with [name] at [phone] lvm.  ? ?CSW has called Earl Lagos with guardianship at [phone] lvm.  ? ? ? ?Expected Discharge Plan:  (TBD) ?Barriers to Discharge: Continued Medical Work up ? ? ?Patient Goals and CMS Choice ?  ?  ?  ? ?Expected Discharge Plan and Services ?Expected Discharge Plan:  (TBD) ?  ?  ?  ?  ?                ?  ?  ?  ?  ?  ?  ?  ?  ?  ?  ? ?Prior Living Arrangements/Services ?  ?  ?  ?       ?  ?  ?  ?  ? ?Activities of Daily Living ?Home Assistive Devices/Equipment: Gilford Rile (specify type) ?ADL Screening (condition at time of admission) ?Patient's cognitive ability adequate to safely complete daily activities?: No ?Is the patient deaf or have difficulty hearing?: No ?Does the patient have difficulty seeing, even when wearing glasses/contacts?: No ?Does the patient have difficulty concentrating, remembering, or making decisions?: Yes ?Patient able to express need for assistance with ADLs?: No ?Does the patient have difficulty dressing or bathing?: Yes ?Independently performs ADLs?: No ?Communication: Independent ?Dressing (OT): Needs assistance ?Is this a change from baseline?: Pre-admission baseline ?Grooming: Needs assistance ?Is this a change from baseline?: Pre-admission baseline ?Feeding: Independent ?Bathing: Needs assistance ?Is this a change from baseline?: Pre-admission baseline ?Toileting: Needs assistance ?Is this a change from baseline?: Pre-admission baseline ?In/Out Bed: Needs  assistance ?Is this a change from baseline?: Pre-admission baseline ?Walks in Home: Needs assistance ?Is this a change from baseline?: Pre-admission baseline ?Does the patient have difficulty walking or climbing stairs?: Yes ?Weakness of Legs: Both ?Weakness of Arms/Hands: None ? ?Permission Sought/Granted ?  ?  ?   ?   ?   ?   ? ?Emotional Assessment ?  ?  ?  ?  ?  ?  ? ?Admission diagnosis:  Hyperkalemia [E87.5] ?Heart block AV second degree [I44.1] ?AKI (acute kidney injury) ([place]) [N17.9] ?Altered mental status, unspecified altered mental status type [R41.82] ?COVID-19 virus infection [U07.1] ?Patient Active Problem List  ? Diagnosis Date Noted  ? Sinus bradycardia 12/13/2021  ? Thrush 12/13/2021  ? COVID-19 virus infection 12/12/2021  ? Hypernatremia 12/12/2021  ? Hyperkalemia 12/12/2021  ? Acute kidney injury superimposed on chronic kidney disease ([place]) 12/12/2021  ? Acute metabolic encephalopathy 0000000  ? TIA (transient ischemic attack) 12/31/2020  ? CKD (chronic kidney disease), stage IIIa 12/31/2020  ? Depression with anxiety 12/31/2020  ? HLD (hyperlipidemia) 08/31/2019  ? Sepsis ([place]) 08/31/2019  ? UTI (urinary tract infection) 08/31/2019  ? Lewy body dementia ([place])   ? COPD (chronic obstructive pulmonary disease) ([place])   ? Hypothermia   ? Cold exposure   ? Elevated troponin   ? Leucocytosis   ? ?PCP:  Haynes Hoehn,  NP ?Pharmacy:   ?[facility] [place],  - [address] ?[address] ?[address] ?[place] [address] ?Phone: [phone] Fax: [phone] ? ? ? ? ?Social Determinants of Health (SDOH) Interventions ?  ? ?Readmission Risk Interventions ?   ? View : No data to display.  ?  ?  ?  ? ? ? ?

## 2021-12-14 NOTE — Progress Notes (Signed)
?  Progress Note ? ? ?Patient: Shirley Villanueva [id number] DOB: 1928/04/20 DOA: 12/12/2021     2 ?DOS: the patient was seen and examined on 12/14/2021 ?  ?Brief hospital course: ?88 year old female with history of Lewy body dementia, COPD sent in for altered mental status.  Was diagnosed with COVID on 12/02/2021 and has not been eating or drinking very much.  The patient does have a bruise on her forehead secondary to a fall.  Patient was admitted with bradycardia, hypernatremia, hyperkalemia, UTI, and given steroids for COVID infection. ? ?Assessment and Plan: ?* Acute metabolic encephalopathy ?Patient is a less agitated. ? ?Lewy body dementia ([place]) ?Poor prognosis. ? ?Hypernatremia ?Sodium level improved after giving D5 water.  Discontinue D5 water for now.  Due to poor prognosis, will not initiate IV treatment. ? ?Hyperkalemia ?Improved. ? ?Sinus bradycardia ?Asymptomatic, poor prognosis.  No additional work-up is needed. ? ?COVID-19 virus infection ?Continue steroids. ? ?Acute renal failure superimposed on stage 3a chronic kidney disease ([place]) ?Renal function back to baseline ? ?COPD (chronic obstructive pulmonary disease) ([place] ) ?No bronchospasm ? ?Failure to thrive in adult ?Patient has not been eating well, she has developed significant hypernatremia due to dehydration.  Case management is try to getting in touch with [name], patient also likely will be discharged to assisted living with hospice.  Long-term prognosis very poor ? ?Thrush ?Patient appears to have able to take some p.o., will start oral Diflucan. ? ?E. coli UTI ?Continue Rocephin ? ? ? ? ?  ? ?Subjective:  ?Patient was able to eat some breakfast this morning, she is confused which is her baseline.  No agitation. ?No short of breath ? ?Physical Exam: ?Vitals:  ? 12/14/21 0025 12/14/21 0500 12/14/21 0817 12/14/21 1204  ?BP: (!) 144/77 118/75 (!) 127/46   ?Pulse: (!) 37  (!) 120 (!) 38  ?Resp: 18     ?Temp: 97.6 ?F (36.4 ?C)  98 ?F (36.7 ?C) 98.1 ?F (36.7  ?C)  ?TempSrc: Oral  Axillary Axillary  ?SpO2: 100%  91% 98%  ?Weight:      ?Height:      ? ?General exam: Appears calm and comfortable  ?Respiratory system: Clear to auscultation. Respiratory effort normal. ?Cardiovascular system: S1 & S2 heard, RRR. No JVD, murmurs, rubs, gallops or clicks. No pedal edema. ?Gastrointestinal system: Abdomen is nondistended, soft and nontender. No organomegaly or masses felt. Normal bowel sounds heard. ?Central nervous system: Alert and oriented x1. No focal neurological deficits. ?Extremities: Symmetric 5 x 5 power. ?Skin: No rashes, lesions or ulcers ? ? ?Data Reviewed: ? ?Reviewed all lab results and culture results. ? ?Family Communication:  ? ?Disposition: ?Status is: Inpatient ?Remains inpatient appropriate because: Severity of disease, IV antibiotics. ? Planned Discharge Destination:  Assisted living with hospice ? ? ? ?Time spent: 28 minutes ? ?Author: ?Sharen Hones, MD ?12/14/2021 12:11 PM ? ?For on call review [email or web address].  ?

## 2021-12-14 NOTE — Progress Notes (Signed)
Civil engineer, contracting [facility]) Hospital Liaison Note ? ?TOC/Ashley provided MSW with updated guardian contact information. MSW contacted patient's assigned SW, [name] rep/Jessica Hodge/[phone], she states that she cannot provide verbal consent for hospice evaluation and that official documentation is required from hospice staff before evaluation can be conducted. Consent has to be provided by [facility] [name] Director. MSW to relay above request to [facility] Leadership and leadership to follow-up with [name]. ? ?TOC/Ashley aware of above updates.  ? ?Ms. Duhart is not active with [facility] nor is patient under review for services. [facility] will need [name] permission before patient can be reviewed for services. ?  ?Please do not hesitate to call with any hospice related questions.  ?  ?Thank you for the opportunity to participate in this patient's care. ?  ?Shirley Villanueva, MSW ?[facility] Liaison  ?[phone] ?

## 2021-12-14 NOTE — Plan of Care (Signed)
Consult noted for [place]. Notes reviewed. Per notes, patient's LTC facility had reached out to initiate hospice care at their facility prior to admission, but [facility] was not able to reach [name] guardian. Staff here at [facility] have had difficulty reaching [name], who would be the decision maker on care planning. Given this, in order to optimize contact with [name] would recommend continuing to reach out to guardian, and when contact is made, clarifying initial plans for hospice prior to this admission, and if confirmed, request [facility] to move forward with their initial request. Please let us know if there are further needs following this.  ?

## 2021-12-14 NOTE — Assessment & Plan Note (Addendum)
Pending [name] approval for hospice.  Patient will be discharged to assisted living facility with hospice referral, patient prognosis is  probably 1 to 58-month. ?

## 2021-12-15 DIAGNOSIS — G3183 Dementia with Lewy bodies: Secondary | ICD-10-CM | POA: Diagnosis not present

## 2021-12-15 DIAGNOSIS — F0283 Dementia in other diseases classified elsewhere, unspecified severity, with mood disturbance: Secondary | ICD-10-CM | POA: Diagnosis not present

## 2021-12-15 DIAGNOSIS — U071 COVID-19: Secondary | ICD-10-CM | POA: Diagnosis not present

## 2021-12-15 DIAGNOSIS — G9341 Metabolic encephalopathy: Secondary | ICD-10-CM | POA: Diagnosis not present

## 2021-12-15 DIAGNOSIS — I441 Atrioventricular block, second degree: Secondary | ICD-10-CM

## 2021-12-15 LAB — URINE CULTURE: Culture: >=100000 — AB

## 2021-12-15 MED ORDER — LOPERAMIDE HCL 2 MG PO CAPS
2.0000 mg | ORAL_CAPSULE | Freq: Once | ORAL | Status: AC
  Administered 2021-12-15: 2 mg via ORAL
  Filled 2021-12-15: qty 1

## 2021-12-15 MED ORDER — ONDANSETRON HCL 4 MG/2ML IJ SOLN
4.0000 mg | Freq: Four times a day (QID) | INTRAMUSCULAR | Status: DC | PRN
  Administered 2021-12-15: 4 mg via INTRAVENOUS
  Filled 2021-12-15: qty 2

## 2021-12-15 NOTE — Progress Notes (Addendum)
Speech Language Pathology Treatment: Dysphagia  ?Patient Details ?Name: Shirley Villanueva ?MRN: [id number] ?DOB: [birth year] ?Today's Date: 12/15/2021 ?Time: 4259-5638 ?SLP Time Calculation (min) (ACUTE ONLY): 45 min ? ?Assessment / Plan / Recommendation ?Clinical Impression ? Pt seen for ongoing assessment of swallowing; trials to upgrade diet to thin liquids today. Pt continues to present w/ grossly functional oropharyngeal phase swallow function but is SIGNIFICANTLY impacted by decreased awareness during oral intake tasks/po intake d/t declined Cognitive status, baseline Lewy Body Dementia. The Cognitive decline impacts her overall awareness/engagement w/ po tasks; this can increase risk for choking/aspiration. Pt is also Edentulous at Baseline.  ? ?Pt was pleasant, talkative w/ tangential verbalizations. Ongoing, constant body/motor movements during session. She required MOD-MAX verbal/visual/tactile cues to orient to po tasks and follow through w/ them -- although, once Mitts were removed, she was able to use her hands to straighten her covers and HOLD the CUP for drinking liquids. This increases safety w/ oral intake of liquids when she can hold her own Cup.  ?Though risk for aspiration is present d/t the Cognitive decline, it is reduced when following general aspiration precautions and when directed/supported during the meal w/ setup and guidance w/ self-feeding. Support w/ full feeding appears necessary d/t confusion w/ using utensil.  ? ?During po trials, she required ongoing cues for orientation to food and drink items presented; and an overall need to REDUCE DISTRACTIONS during po intake is necessary. She was positioned upright in bed then supported in self-feeding/consuming trials of thin liquids via Straw holding Cup and purees w/ No overt clinical s/s of aspiration noted; no decline in verbalizations, no cough, and no decline in respiratory status during/post trials. Oral phase was grossly [facility] for bolus  management and oral clearing of the boluses given; min increased time when distracted and talking but oral clearing was completed b/t trials. ?  ?Recommend a Dysphagia level 1(puree) w/ Thin liquids via straw w/ pt Holding Cup when drinking; general aspiration precautions; reduce Distractions during meals. Offer Cues to redirect attention to po tasks. Pills Crushed in Puree for safer swallowing. Support at all meals d/t Cognitive decline/Dementia. Offer drink supplements to support nutrition(ease of intake). Recommend Dietician f/u. NSG/MD updated. MD to reconsult ST services if any new needs arise while admitted. Precautions posted. ? ?This diet consistency appears to most beneficial for pt to attempt to meat nutrition/hydration needs safely -- pt is Edentulous at Baseline w/ Significant Dementia impacting her overall awareness during oral intake. Recommend f/u w/ Palliative Care services for ongoing education re: impact of Dementia on swallowing and overall oral intake. ? ? ? ? ? ?  ?HPI HPI: Pt is a 88 y.o. female with medical history significant for Lewy Body Dementia, severe cognitive-linguistic deficits consistent with baseline function/ Lewy body dementia per cog-linguistic assessment in 2022 during admit, COPD, total care for ADLs at her facility.  She was COVID+ about a week ago and since that time, she has somewhat declined has been more weak and has had several falls per chart notes at admit.  Her legal guardian is [name], Patient is being evaluated for Hospice at her facility per chart note.   CXR: No active disease; Head CT: no acute change; chronic changes noted. ?  ?   ?SLP Plan ? All goals met ? ?  ?  ?Recommendations for follow up therapy are one component of a multi-disciplinary discharge planning process, led by the attending physician.  Recommendations may be updated based on patient status, additional functional  criteria and insurance authorization. ?  ? ?Recommendations  ?Diet recommendations:  Dysphagia 1 (puree);Thin liquid ?Liquids provided via: Cup;Straw (monitor) ?Medication Administration: Crushed with puree ?Supervision: Staff to assist with self feeding;Full supervision/cueing for compensatory strategies (pt to hold Cup when drinking) ?Compensations: Minimize environmental distractions;Slow rate;Small sips/bites;Lingual sweep for clearance of pocketing;Follow solids with liquid ?Postural Changes and/or Swallow Maneuvers: Out of bed for meals;Seated upright 90 degrees;Upright 30-60 min after meal  ?   ?    ?   ? ? ? ? General recommendations:  (Dietician f/u; Palliative Care/Hospice f/u) ?Oral Care Recommendations: Oral care BID;Oral care before and after PO;Staff/trained caregiver to provide oral care ?Follow Up Recommendations: Follow physician's recommendations for discharge plan and follow up therapies ?Assistance recommended at discharge: Frequent or constant Supervision/Assistance ?SLP Visit Diagnosis: Dysphagia, oral phase (R13.11) (baseline Lewy Body Dementia; Edentulous status baseline) ?Plan: All goals met ? ? ? ? ?  ?  ? ? ? ? ? ?Orinda Kenner, MS, CCC-SLP ?Speech Language Pathologist ?Rehab Services; [place] ?[phone] (ascom) ?, ? ?12/15/2021, 3:07 PM ?

## 2021-12-15 NOTE — Assessment & Plan Note (Signed)
Patient has significant bradycardia, telemetry showed 2:1 conduction.  Due to poor prognosis, patient is not a candidate for pacemaker. ?

## 2021-12-15 NOTE — Progress Notes (Signed)
?  Progress Note ? ? ?Patient: Shirley Villanueva [id number] DOB: [birth year] DOA: 12/12/2021     3 ?DOS: the patient was seen and examined on 12/15/2021 ?  ?Brief hospital course: ?88 year old female with history of Lewy body dementia, COPD sent in for altered mental status.  Was diagnosed with COVID on 12/02/2021 and has not been eating or drinking very much.  The patient does have a bruise on her forehead secondary to a fall.  Patient was admitted with bradycardia, hypernatremia, hyperkalemia, UTI, and given steroids for COVID infection. ? ?Patient is treated for COPD exacerbation and UTI.  She has a very poor prognosis, currently pending [name] approval for hospice.   ? ?Assessment and Plan: ?* Acute metabolic encephalopathy ?Patient is  less agitated. ? ?Lewy body dementia ([place]) ?Poor prognosis.  Currently pending decision from [name] for hospice. ?Most likely patient will be discharged to facility with hospice tomorrow while pending approval from [name] ? ?Hypernatremia ?Sodium level has normalized. ?Due to poor prognosis, I would not restart IV fluids. ? ?Hyperkalemia ?Improved. ? ?Sinus bradycardia ?Asymptomatic, poor prognosis.  No additional work-up is needed. ? ?COVID-19 virus infection ?Continue steroids. ? ?Acute renal failure superimposed on stage 3a chronic kidney disease ([place]) ?Renal function back to baseline ? ?COPD (chronic obstructive pulmonary disease) ([place]) ?No bronchospasm ? ?AV block, Mobitz 2 ?Patient has significant bradycardia, telemetry showed 2:1 conduction.  Due to poor prognosis, patient is not a candidate for pacemaker. ? ?Failure to thrive in adult ?Pending [name] approval for hospice.  Most likely will be discharged to facility with hospice referral tomorrow ? ?Thrush ?Patient appears to have able to take some p.o., will start oral Diflucan. ? ?E. coli UTI ?Today we will be 4th dose of Rocephin, will discontinue afterwards. ? ? ? ? ?  ? ?Subjective:  ?Patient is confused as with her baseline. ?No short  of breath. ?Spoke with her, patient ate 75% of breakfast today. ? ?Physical Exam: ?Vitals:  ? 12/14/21 1833 12/15/21 0020 12/15/21 0420 12/15/21 0827  ?BP: (!) 128/39 (!) 148/52 (!) 142/70 (!) 154/66  ?Pulse: (!) 40 (!) 40 (!) 42 (!) 45  ?Resp: 20 18 18    ?Temp: 97.8 ?F (36.6 ?C) 98.3 ?F (36.8 ?C) 98.2 ?F (36.8 ?C) 97.7 ?F (36.5 ?C)  ?TempSrc: Oral Oral Axillary Oral  ?SpO2: 93% 100% 98% 97%  ?Weight:      ?Height:      ? ?General exam: Appears calm and comfortable  ?Respiratory system: Clear to auscultation. Respiratory effort normal. ?Cardiovascular system: S1 & S2 heard, RRR. No JVD, murmurs, rubs, gallops or clicks. No pedal edema. ?Gastrointestinal system: Abdomen is nondistended, soft and nontender. No organomegaly or masses felt. Normal bowel sounds heard. ?Central nervous system: Alert and oriented x1. No focal neurological deficits. ?Extremities: Symmetric 5 x 5 power. ?Skin: No rashes, lesions or ulcers ?  ? ?Data Reviewed: ? ?Get all lab results. ? ?Family Communication:  ? ?Disposition: ?Status is: Inpatient ?Remains inpatient appropriate because: Unsafe discharge, will discharge tomorrow with hospice referral. ? Planned Discharge Destination:  Long-term care with hospice referral ? ? ? ?Time spent: 25 minutes ? ?Author: ?Sharen Hones, MD ?12/15/2021 11:56 AM ? ?For on call review [email or web address].  ?

## 2021-12-15 NOTE — TOC Progression Note (Addendum)
Transition of Care (TOC) - Progression Note  ? ? ?Patient Details  ?Name: Shirley Villanueva ?MRN: [id number] ?Date of Birth: 1928-03-01 ? ?Transition of Care (TOC) CM/SW Contact  ?Alberteen Sam, LCSW ?Phone Number: ?12/15/2021, 3:21 PM ? ?Clinical Narrative:    ? ?CSW spoke with [name] to inform patient will return tomorrow with hospice.  ? ?They report to please call [phone] and ask for Tammy to inform when patient returns. They request all clinicals be faxed to [phone]. ? ?Also please notify Earl Lagos with [name] on patient's dc tomorrow at [phone] as they are guardians.  ? ?Expected Discharge Plan:  (TBD) ?Barriers to Discharge: Continued Medical Work up ? ?Expected Discharge Plan and Services ?Expected Discharge Plan:  (TBD) ?  ?  ?  ?  ?                ?  ?  ?  ?  ?  ?  ?  ?  ?  ?  ? ? ?Social Determinants of Health (SDOH) Interventions ?  ? ?Readmission Risk Interventions ?   ? View : No data to display.  ?  ?  ?  ? ? ?

## 2021-12-15 NOTE — Plan of Care (Signed)

## 2021-12-15 NOTE — Progress Notes (Signed)
Manufacturing engineer [facility]) Hospital Liaison Note ? ?Due to needed documents requested by [name] staff, [facility] has contacted [facility] community representatives to follow up with hospice referral upon patient DC to [place]. Per MD/Dr. Roosevelt Locks, plan is for patient to return to [facility] tomorrow. Once discharged, [facility] will follow up and begin hospice evaluation process once permission received from [place].  ?  ?TOC/Ashley aware of above updates.  ?  ?Ms. Paull is not active with [facility] nor is patient under review for services. [facility] will need [name] permission before patient can be reviewed for services. ? ?[facility] signing off, but is available if there are any additional questions. ?  ?Please do not hesitate to call with any hospice related questions.  ?  ?Thank you for the opportunity to participate in this patient's care. ?  ?Daphene Calamity, MSW ?[place]  ?[phone] ?

## 2021-12-15 NOTE — Progress Notes (Addendum)
Cross Cover ?Nurse reports patient with nausea and vomiting. Zofran ordered and dose of imodium ordered for frequent diarrhea stools ?

## 2021-12-16 DIAGNOSIS — G3183 Dementia with Lewy bodies: Secondary | ICD-10-CM | POA: Diagnosis not present

## 2021-12-16 DIAGNOSIS — G9341 Metabolic encephalopathy: Secondary | ICD-10-CM | POA: Diagnosis not present

## 2021-12-16 DIAGNOSIS — N390 Urinary tract infection, site not specified: Secondary | ICD-10-CM | POA: Diagnosis not present

## 2021-12-16 DIAGNOSIS — E43 Unspecified severe protein-calorie malnutrition: Secondary | ICD-10-CM

## 2021-12-16 DIAGNOSIS — U071 COVID-19: Secondary | ICD-10-CM | POA: Diagnosis not present

## 2021-12-16 MED ORDER — FLUCONAZOLE NICU/PED ORAL SYRINGE 10 MG/ML
100.0000 mg | ORAL | 0 refills | Status: AC
Start: 2021-12-16 — End: 2021-12-20

## 2021-12-16 MED ORDER — DEXAMETHASONE 6 MG PO TABS: 6.0000 mg | ORAL_TABLET | Freq: Every day | ORAL | 0 refills | Status: AC

## 2021-12-16 MED ORDER — ALPRAZOLAM 0.25 MG PO TABS: 0.2500 mg | ORAL_TABLET | Freq: Two times a day (BID) | ORAL | 0 refills | Status: DC | PRN

## 2021-12-16 NOTE — TOC Transition Note (Signed)
Transition of Care (TOC) - CM/SW Discharge Note ? ? ?Patient Details  ?Name: Shirley Villanueva ?MRN: [id number] ?Date of Birth: [birth year] ? ?Transition of Care (TOC) CM/SW Contact:  ?Atif Chapple E Braylee Lal, LCSW ?Phone Number: ?12/16/2021, 9:27 AM ? ? ?Clinical Narrative:    ?Discharge to back to [facility] with [facility]. ?Confirmed with Tammy at [facility] and faxed FL2 and DC Summary. ?Confirmed with [name] Legal Lowella Curb and faxed FL2 and DC Summary.  ?Confirmed with Shania with Hospice and informed her of DME needs (hospital bed and wheelchair, does not need it before DC) per Tammy.  ?EMS paperwork completed and per RN patient is ready for transport.  ?Patient is 3rd on the list for EMS as of 9:28 am, RN and charge RN aware.  ? ? ? ?Final next level of care: Assisted Living (with hospice) ?Barriers to Discharge: Barriers Resolved ? ? ?Patient Goals and CMS Choice ?  ?CMS Medicare.gov Compare Post Acute Care list provided to:: Legal Guardian ?Choice offered to / list presented to : [facility] POA / Guardian ? ?Discharge Placement ?  ?           ?  ?Patient to be transferred to facility by: [facility] ?Name of family member notified: Legal Guardian [name] ?Patient and family notified of of transfer: 12/16/21 ? ?Discharge Plan and Services ?  ?  ?           ?  ?  ?  ?  ?  ?  ?  ?  ?  ?  ? ?Social Determinants of Health (SDOH) Interventions ?  ? ? ?Readmission Risk Interventions ?   ? View : No data to display.  ?  ?  ?  ? ? ? ? ? ?

## 2021-12-16 NOTE — Progress Notes (Signed)
Grand daughter notified that EMS is here to transport back tot [facility] ?

## 2021-12-16 NOTE — Discharge Summary (Signed)
?Physician Discharge Summary ?  ?Patient: Shirley Villanueva MRN: [id number] DOB: March 29, 1928  ?Admit date:     12/12/2021  ?Discharge date: 12/16/21  ?Discharge Physician: Sharen Hones  ? ?PCP: Haynes Hoehn, NP  ? ?Recommendations at discharge:  ? ?Follow-up with hospice after approval from [name]. ?Follow-up with PCP in 1 week ? ?Discharge Diagnoses: ?Principal Problem: ?  Acute metabolic encephalopathy ?Active Problems: ?  Lewy body dementia ([place]) ?  Hypernatremia ?  Hyperkalemia ?  COVID-19 virus infection ?  Acute renal failure superimposed on stage 3a chronic kidney disease ([place]) ?  COPD (chronic obstructive pulmonary disease) ([place]) ?  E. coli UTI ?  Thrush ?  Failure to thrive in adult ?  AV block, Mobitz 2 ?  Protein-calorie malnutrition, severe ([place]) ? ?Resolved Problems: ?  * No resolved hospital problems. * ? ?Hospital Course: ?88 year old female with history of Lewy body dementia, COPD sent in for altered mental status.  Was diagnosed with COVID on 12/02/2021 and has not been eating or drinking very much.  The patient does have a bruise on her forehead secondary to a fall.  Patient was admitted with bradycardia, hypernatremia, hyperkalemia, UTI, and given steroids for COVID infection. ? ?Patient is treated for COPD exacerbation and UTI.  She has a very poor prognosis, currently pending [name] approval for hospice.   ? ?Assessment and Plan: ?* Acute metabolic encephalopathy ?Patient is  less agitated. ? ?Lewy body dementia ([place]) ?Poor prognosis.  Currently pending decision from [name] for hospice. ?Resume home medicines. ? ?Hypernatremia ?Sodium level has normalized. ? ? ?Hyperkalemia ?Improved. ? ?Sinus bradycardia ?Asymptomatic, poor prognosis.  No additional work-up is needed. ? ?COVID-19 virus infection ?Continue steroids. ? ?Acute renal failure superimposed on stage 3a chronic kidney disease ([place]) ?Renal function back to baseline ? ?COPD (chronic obstructive pulmonary disease) ([place]) ?No  bronchospasm ? ?Protein-calorie malnutrition, severe ([place]) ?Based on my examination, patient has a severe muscle atrophy and a severe protein calorie malnutrition.  Encourage p.o. intake, patient long-term prognosis very poor.  She is referred to hospice, but still waiting for [name] approval ? ?AV block, Mobitz 2 ?Patient has significant bradycardia, telemetry showed 2:1 conduction.  Due to poor prognosis, patient is not a candidate for pacemaker. ? ?Failure to thrive in adult ?Pending [name] approval for hospice.  Patient will be discharged to assisted living facility with hospice referral, patient prognosis is  probably 1 to 4-month. ? ?Thrush ?Continue to finish 7-day course with oral Diflucan. ? ?E. coli UTI ?Completed antibiotics. ? ? ? ? ?  ? ? ?Consultants: None ?Procedures performed: None  ?Disposition:  ALF with hospice ?Diet recommendation:  ?Discharge Diet Orders (From admission, onward)  ? ?  Start     Ordered  ? 12/16/21 0000  Diet - low sodium heart healthy       ? 12/16/21 0849  ? ?  ?  ? ?  ? ?Cardiac diet ?DISCHARGE MEDICATION: ?Allergies as of 12/16/2021   ?No Known Allergies ?  ? ?  ?Medication List  ?  ? ?STOP taking these medications   ? ?amLODipine 5 MG tablet ?Commonly known as: NORVASC ?  ?aspirin EC 81 MG tablet ?  ?Calcium Carbonate-Vitamin D3 600-400 MG-UNIT Tabs ?  ?multivitamin-lutein Caps capsule ?  ?PreserVision AREDS 2 Caps ?  ?traMADol 50 MG tablet ?Commonly known as: ULTRAM ?  ?traZODone 50 MG tablet ?Commonly known as: DESYREL ?  ? ?  ? ?TAKE these medications   ? ?ALPRAZolam 0.25 MG  tablet ?Commonly known as: Duanne Moron ?Take 0.25 mg by mouth 2 (two) times daily as needed. ?What changed: Another medication with the same name was removed. Continue taking this medication, and follow the directions you see here. ?  ?atorvastatin 20 MG tablet ?Commonly known as: LIPITOR ?Take 20 mg by mouth daily. ?  ?calcium carbonate 1250 (500 Ca) MG tablet ?Commonly known as: OS-CAL - dosed in mg of elemental  calcium ?Take 1 tablet by mouth. ?  ?dexamethasone 6 MG tablet ?Commonly known as: DECADRON ?Take 1 tablet (6 mg total) by mouth daily for 6 days. ?  ?divalproex 125 MG capsule ?Commonly known as: DEPAKOTE SPRINKLE ?Take 250 mg by mouth 2 (two) times daily. ?  ?fluconazole 10 mg/mL Susp ?Commonly known as: DIFLUCAN ?Take 10 mLs (100 mg total) by mouth daily for 4 days. ?  ?melatonin 3 MG Tabs tablet ?Take 10 mg by mouth at bedtime. ?  ?omeprazole 20 MG capsule ?Commonly known as: PRILOSEC ?Take 20 mg by mouth daily. ?  ?PARoxetine 30 MG tablet ?Commonly known as: PAXIL ?Take 30 mg by mouth daily. ?  ?senna-docusate 8.6-50 MG tablet ?Commonly known as: Senokot-S ?Take 2 tablets by mouth daily. ?  ? ?  ? ? Follow-up Information   ? ? Haynes Hoehn, NP Follow up in 1 week(s).   ?Contact information: ?[address] ?[place] [address] ?[phone] ? ? ?  ?  ? ?  ?  ? ?  ? ?Discharge Exam: ?Filed Weights  ? 12/12/21 1513  ?Weight: 59.4 kg  ? ?General exam: Appears calm and comfortable, appears severely malnourished ?Respiratory system: Clear to auscultation. Respiratory effort normal. ?Cardiovascular system: S1 & S2 heard, RRR. No JVD, murmurs, rubs, gallops or clicks. No pedal edema. ?Gastrointestinal system: Abdomen is nondistended, soft and nontender. No organomegaly or masses felt. Normal bowel sounds heard. ?Central nervous system: Alert and oriented x1. No focal neurological deficits. ?Extremities: Significant muscle atrophy ?Skin: No rashes, lesions or ulcers ? ? ?Condition at discharge: fair ? ?The results of significant diagnostics from this hospitalization (including imaging, microbiology, ancillary and laboratory) are listed below for reference.  ? ?Imaging Studies: ?CT HEAD WO CONTRAST (5MM) ? ?Result Date: 12/12/2021 ?CLINICAL DATA:  Altered mental status EXAM: CT HEAD WITHOUT CONTRAST TECHNIQUE: Contiguous axial images were obtained from the base of the skull through the vertex without intravenous  contrast. RADIATION DOSE REDUCTION: This exam was performed according to the departmental dose-optimization program which includes automated exposure control, adjustment of the mA and/or kV according to patient size and/or use of iterative reconstruction technique. COMPARISON:  12/31/2020 FINDINGS: Brain: No acute intracranial findings are seen. There is dilation of third and both lateral ventricles. There is no shift of midline structures. Cortical sulci are prominent. Minimal calcifications are seen in the basal ganglia on the left side. Vascular: Scattered arterial calcifications are seen. Skull: No fracture is seen in the calvarium. Sinuses/Orbits: There is mild mucosal thickening in the ethmoid sinus. Other: None IMPRESSION: No acute intracranial findings are seen in noncontrast CT brain. Central and cortical atrophy. Electronically Signed   By: Elmer Picker M.D.   On: 12/12/2021 15:42  ? ?DG Chest Portable 1 View ? ?Result Date: 12/12/2021 ?CLINICAL DATA:  Chest pain. EXAM: PORTABLE CHEST 1 VIEW COMPARISON:  December 31, 2020. FINDINGS: The heart size and mediastinal contours are within normal limits. Both lungs are clear. The visualized skeletal structures are unremarkable. IMPRESSION: No active disease. Electronically Signed   By: Marijo Conception  M.D.   On: 12/12/2021 15:39   ? ?Microbiology: ?Results for orders placed or performed during the hospital encounter of 12/12/21  ?Resp Panel by RT-PCR (Flu A&B, Covid) Nasopharyngeal Swab     Status: Abnormal  ? Collection Time: 12/12/21  3:20 PM  ? Specimen: Nasopharyngeal Swab; Nasopharyngeal(NP) swabs in vial transport medium  ?Result Value Ref Range Status  ? SARS Coronavirus 2 by RT PCR POSITIVE (A) NEGATIVE Final  ?  Comment: (NOTE) ?SARS-CoV-2 target nucleic acids are DETECTED. ? ?The SARS-CoV-2 RNA is generally detectable in upper respiratory ?specimens during the acute phase of infection. Positive results are ?indicative of the presence of the identified  virus, but do not rule ?out bacterial infection or co-infection with other pathogens not ?detected by the test. Clinical correlation with patient history and ?other diagnostic information is necessary to determine patient ?inf

## 2021-12-16 NOTE — Plan of Care (Signed)

## 2021-12-16 NOTE — Assessment & Plan Note (Signed)
Based on my examination, patient has a severe muscle atrophy and a severe protein calorie malnutrition.  Encourage p.o. intake, patient long-term prognosis very poor.  She is referred to hospice, but still waiting for [name] approval ?

## 2021-12-16 NOTE — [place] FL2 (Addendum)
?[place] MEDICAID FL2 LEVEL OF CARE SCREENING TOOL  ?  ? ?IDENTIFICATION  ?Patient Name: ?Shirley Villanueva Birthdate: May 20, 1928 Sex: female Admission Date (Current Location): ?12/12/2021  ?[place] and [place] Number: ? [place] ?  Facility and Address:  ?[facility], [address], [place], [place] [address] ?     Provider Number: ?[id number]  ?Attending Physician Name and Address:  ?Sharen Hones, MD ? Relative Name and Phone Number:  ?Hodge,Jessica - APS Legal Guardian -  [phone] ?   ?Current Level of Care: ?Hospital Recommended Level of Care: ?Assisted Living Facility Prior Approval Number: ?  ? ?Date Approved/Denied: ?  PASRR Number: ?  ? ?Discharge Plan: ?  ?  ? ?Current Diagnoses: ?Patient Active Problem List  ? Diagnosis Date Noted  ? Protein-calorie malnutrition, severe ([place]) 12/16/2021  ? AV block, Mobitz 2 12/15/2021  ? Failure to thrive in adult 12/14/2021  ? Sinus bradycardia 12/13/2021  ? Thrush 12/13/2021  ? COVID-19 virus infection 12/12/2021  ? Hypernatremia 12/12/2021  ? Hyperkalemia 12/12/2021  ? Acute renal failure superimposed on stage 3a chronic kidney disease ([place]) 12/12/2021  ? Acute metabolic encephalopathy 0000000  ? TIA (transient ischemic attack) 12/31/2020  ? CKD (chronic kidney disease), stage IIIa 12/31/2020  ? Depression with anxiety 12/31/2020  ? HLD (hyperlipidemia) 08/31/2019  ? Sepsis ([place]) 08/31/2019  ? E. coli UTI 08/31/2019  ? Lewy body dementia ([place])   ? COPD (chronic obstructive pulmonary disease) ([place])   ? Hypothermia   ? Cold exposure   ? Elevated troponin   ? Leucocytosis   ? ? ?Orientation RESPIRATION BLADDER Height & Weight   ?  ?  ? Normal Incontinent Weight: 131 lb (59.4 kg) ?Height:  5' (152.4 cm)  ?BEHAVIORAL SYMPTOMS/MOOD NEUROLOGICAL BOWEL NUTRITION STATUS  ?    Incontinent Diet (dys 1)  ?AMBULATORY STATUS COMMUNICATION OF NEEDS Skin   ?Total Care   Other (Comment) (erythema) ?  ?  ?  ?    ?     ?     ? ? ?Personal Care Assistance  Level of Assistance  ?Bathing, Feeding, Dressing, Total care Bathing Assistance: Maximum assistance ?Feeding assistance: Maximum assistance ?Dressing Assistance: Maximum assistance ?Total Care Assistance: Maximum assistance  ? ?Functional Limitations Info  ?    ?  ?   ? ? ?SPECIAL CARE FACTORS FREQUENCY  ?    ?  ?  ?  ?  ?  ?  ?   ? ? ?Contractures    ? ? ?Additional Factors Info  ?Code Status, Allergies   ?Allergies Info: nka ?  ?  ?  ?   ? ? ?STOP taking these medications   ?  ?amLODipine 5 MG tablet ?Commonly known as: NORVASC ?   ?aspirin EC 81 MG tablet ?   ?Calcium Carbonate-Vitamin D3 600-400 MG-UNIT Tabs ?   ?multivitamin-lutein Caps capsule ?   ?PreserVision AREDS 2 Caps ?   ?traMADol 50 MG tablet ?Commonly known as: ULTRAM ?   ?traZODone 50 MG tablet ?Commonly known as: DESYREL ?   ?  ?   ?  ?TAKE these medications   ?  ?ALPRAZolam 0.25 MG tablet ?Commonly known as: Duanne Moron ?Take 0.25 mg by mouth 2 (two) times daily as needed. ?What changed: Another medication with the same name was removed. Continue taking this medication, and follow the directions you see here. ?   ?atorvastatin 20 MG tablet ?Commonly known as: LIPITOR ?Take 20 mg by mouth daily. ?   ?  calcium carbonate 1250 (500 Ca) MG tablet ?Commonly known as: OS-CAL - dosed in mg of elemental calcium ?Take 1 tablet by mouth. ?   ?dexamethasone 6 MG tablet ?Commonly known as: DECADRON ?Take 1 tablet (6 mg total) by mouth daily for 6 days. ?   ?divalproex 125 MG capsule ?Commonly known as: DEPAKOTE SPRINKLE ?Take 250 mg by mouth 2 (two) times daily. ?   ?fluconazole 10 mg/mL Susp ?Commonly known as: DIFLUCAN ?Take 10 mLs (100 mg total) by mouth daily for 4 days. ?   ?melatonin 3 MG Tabs tablet ?Take 10 mg by mouth at bedtime. ?   ?omeprazole 20 MG capsule ?Commonly known as: PRILOSEC ?Take 20 mg by mouth daily. ?   ?PARoxetine 30 MG tablet ?Commonly known as: PAXIL ?Take 30 mg by mouth daily. ?   ?senna-docusate 8.6-50 MG tablet ?Commonly known as:  Senokot-S ?Take 2 tablets by mouth daily.  ? ? ? ?Relevant Imaging Results: ? ?Relevant Lab Results: ? ? ?Additional Information ?SS #: [id number] ; [place] ? ?Kotaro Buer E Mahek Schlesinger, LCSW ? ? ? ? ?

## 2022-01-01 ENCOUNTER — Encounter: Payer: Self-pay | Admitting: Internal Medicine

## 2022-01-01 ENCOUNTER — Emergency Department

## 2022-01-01 ENCOUNTER — Other Ambulatory Visit: Payer: Self-pay

## 2022-01-01 ENCOUNTER — Inpatient Hospital Stay
Admission: EM | Admit: 2022-01-01 | Discharge: 2022-01-03 | DRG: 189 | Disposition: A | Discharge status: Hospice | Attending: Internal Medicine | Admitting: Internal Medicine

## 2022-01-01 DIAGNOSIS — R4701 Aphasia: Secondary | ICD-10-CM | POA: Diagnosis present

## 2022-01-01 DIAGNOSIS — E43 Unspecified severe protein-calorie malnutrition: Secondary | ICD-10-CM | POA: Diagnosis present

## 2022-01-01 DIAGNOSIS — F418 Other specified anxiety disorders: Secondary | ICD-10-CM | POA: Diagnosis present

## 2022-01-01 DIAGNOSIS — I2510 Atherosclerotic heart disease of native coronary artery without angina pectoris: Secondary | ICD-10-CM | POA: Diagnosis present

## 2022-01-01 DIAGNOSIS — S2243XA Multiple fractures of ribs, bilateral, initial encounter for closed fracture: Secondary | ICD-10-CM | POA: Diagnosis present

## 2022-01-01 DIAGNOSIS — F02C4 Dementia in other diseases classified elsewhere, severe, with anxiety: Secondary | ICD-10-CM | POA: Diagnosis present

## 2022-01-01 DIAGNOSIS — R279 Unspecified lack of coordination: Secondary | ICD-10-CM | POA: Diagnosis present

## 2022-01-01 DIAGNOSIS — F0283 Dementia in other diseases classified elsewhere, unspecified severity, with mood disturbance: Secondary | ICD-10-CM | POA: Diagnosis not present

## 2022-01-01 DIAGNOSIS — Z9981 Dependence on supplemental oxygen: Secondary | ICD-10-CM | POA: Diagnosis not present

## 2022-01-01 DIAGNOSIS — N1831 Chronic kidney disease, stage 3a: Secondary | ICD-10-CM | POA: Diagnosis present

## 2022-01-01 DIAGNOSIS — U099 Post covid-19 condition, unspecified: Secondary | ICD-10-CM | POA: Diagnosis present

## 2022-01-01 DIAGNOSIS — F02C3 Dementia in other diseases classified elsewhere, severe, with mood disturbance: Secondary | ICD-10-CM | POA: Diagnosis present

## 2022-01-01 DIAGNOSIS — J441 Chronic obstructive pulmonary disease with (acute) exacerbation: Secondary | ICD-10-CM | POA: Diagnosis present

## 2022-01-01 DIAGNOSIS — G3183 Dementia with Lewy bodies: Secondary | ICD-10-CM | POA: Diagnosis present

## 2022-01-01 DIAGNOSIS — R001 Bradycardia, unspecified: Secondary | ICD-10-CM | POA: Diagnosis present

## 2022-01-01 DIAGNOSIS — Z8673 Personal history of transient ischemic attack (TIA), and cerebral infarction without residual deficits: Secondary | ICD-10-CM

## 2022-01-01 DIAGNOSIS — G459 Transient cerebral ischemic attack, unspecified: Secondary | ICD-10-CM | POA: Diagnosis not present

## 2022-01-01 DIAGNOSIS — E860 Dehydration: Secondary | ICD-10-CM | POA: Diagnosis present

## 2022-01-01 DIAGNOSIS — J449 Chronic obstructive pulmonary disease, unspecified: Secondary | ICD-10-CM | POA: Diagnosis not present

## 2022-01-01 DIAGNOSIS — G9341 Metabolic encephalopathy: Secondary | ICD-10-CM | POA: Diagnosis present

## 2022-01-01 DIAGNOSIS — I441 Atrioventricular block, second degree: Secondary | ICD-10-CM | POA: Diagnosis present

## 2022-01-01 DIAGNOSIS — J9601 Acute respiratory failure with hypoxia: Principal | ICD-10-CM | POA: Diagnosis present

## 2022-01-01 DIAGNOSIS — Z6825 Body mass index (BMI) 25.0-25.9, adult: Secondary | ICD-10-CM

## 2022-01-01 DIAGNOSIS — Z79899 Other long term (current) drug therapy: Secondary | ICD-10-CM

## 2022-01-01 DIAGNOSIS — E872 Acidosis, unspecified: Secondary | ICD-10-CM | POA: Diagnosis present

## 2022-01-01 DIAGNOSIS — F03C0 Unspecified dementia, severe, without behavioral disturbance, psychotic disturbance, mood disturbance, and anxiety: Secondary | ICD-10-CM

## 2022-01-01 DIAGNOSIS — I5032 Chronic diastolic (congestive) heart failure: Secondary | ICD-10-CM | POA: Diagnosis present

## 2022-01-01 DIAGNOSIS — R7989 Other specified abnormal findings of blood chemistry: Secondary | ICD-10-CM | POA: Diagnosis present

## 2022-01-01 DIAGNOSIS — F0280 Dementia in other diseases classified elsewhere without behavioral disturbance: Secondary | ICD-10-CM | POA: Diagnosis present

## 2022-01-01 DIAGNOSIS — I272 Pulmonary hypertension, unspecified: Secondary | ICD-10-CM | POA: Diagnosis present

## 2022-01-01 DIAGNOSIS — E785 Hyperlipidemia, unspecified: Secondary | ICD-10-CM | POA: Diagnosis present

## 2022-01-01 DIAGNOSIS — F32A Depression, unspecified: Secondary | ICD-10-CM | POA: Diagnosis present

## 2022-01-01 DIAGNOSIS — S2231XA Fracture of one rib, right side, initial encounter for closed fracture: Secondary | ICD-10-CM | POA: Diagnosis present

## 2022-01-01 LAB — CBC WITH DIFFERENTIAL/PLATELET
Abs Immature Granulocytes: 0.09 10*3/uL — ABNORMAL HIGH (ref 0.00–0.07)
Basophils Absolute: 0.1 10*3/uL (ref 0.0–0.1)
Basophils Relative: 1 %
Eosinophils Absolute: 0 10*3/uL (ref 0.0–0.5)
Eosinophils Relative: 0 %
HCT: 41.3 % (ref 36.0–46.0)
Hemoglobin: 12.8 g/dL (ref 12.0–15.0)
Immature Granulocytes: 1 %
Lymphocytes Relative: 9 %
Lymphs Abs: 1 10*3/uL (ref 0.7–4.0)
MCH: 29.1 pg (ref 26.0–34.0)
MCHC: 31 g/dL (ref 30.0–36.0)
MCV: 93.9 fL (ref 80.0–100.0)
Monocytes Absolute: 0.5 10*3/uL (ref 0.1–1.0)
Monocytes Relative: 4 %
Neutro Abs: 9.4 10*3/uL — ABNORMAL HIGH (ref 1.7–7.7)
Neutrophils Relative %: 85 %
Platelets: 251 10*3/uL (ref 150–400)
RBC: 4.4 MIL/uL (ref 3.87–5.11)
RDW: 15.6 % — ABNORMAL HIGH (ref 11.5–15.5)
WBC: 11.1 10*3/uL — ABNORMAL HIGH (ref 4.0–10.5)
nRBC: 0 % (ref 0.0–0.2)

## 2022-01-01 LAB — BLOOD GAS, VENOUS
Acid-Base Excess: 0.9 mmol/L (ref 0.0–2.0)
Bicarbonate: 27.5 mmol/L (ref 20.0–28.0)
O2 Saturation: 55 %
Patient temperature: 37
pCO2, Ven: 51 mmHg (ref 44–60)
pH, Ven: 7.34 (ref 7.25–7.43)
pO2, Ven: 35 mmHg (ref 32–45)

## 2022-01-01 LAB — COMPREHENSIVE METABOLIC PANEL
ALT: 15 U/L (ref 0–44)
AST: 24 U/L (ref 15–41)
Albumin: 3 g/dL — ABNORMAL LOW (ref 3.5–5.0)
Alkaline Phosphatase: 66 U/L (ref 38–126)
Anion gap: 7 (ref 5–15)
BUN: 36 mg/dL — ABNORMAL HIGH (ref 8–23)
CO2: 26 mmol/L (ref 22–32)
Calcium: 8.8 mg/dL — ABNORMAL LOW (ref 8.9–10.3)
Chloride: 106 mmol/L (ref 98–111)
Creatinine, Ser: 1.39 mg/dL — ABNORMAL HIGH (ref 0.44–1.00)
GFR, Estimated: 36 mL/min — ABNORMAL LOW (ref >60)
Glucose, Bld: 195 mg/dL — ABNORMAL HIGH (ref 70–99)
Potassium: 4.2 mmol/L (ref 3.5–5.1)
Sodium: 139 mmol/L (ref 135–145)
Total Bilirubin: 1.1 mg/dL (ref 0.3–1.2)
Total Protein: 5.2 g/dL — ABNORMAL LOW (ref 6.5–8.1)

## 2022-01-01 LAB — LACTIC ACID, PLASMA
Lactic Acid, Venous: 2.6 mmol/L (ref 0.5–1.9)
Lactic Acid, Venous: 4.1 mmol/L (ref 0.5–1.9)
Lactic Acid, Venous: 2.3 mmol/L (ref 0.5–1.9)
Lactic Acid, Venous: 2.2 mmol/L (ref 0.5–1.9)

## 2022-01-01 LAB — BRAIN NATRIURETIC PEPTIDE: B Natriuretic Peptide: 120.8 pg/mL — ABNORMAL HIGH (ref 0.0–100.0)

## 2022-01-01 MED ORDER — IPRATROPIUM-ALBUTEROL 0.5-2.5 (3) MG/3ML IN SOLN
3.0000 mL | RESPIRATORY_TRACT | Status: DC
Start: 2022-01-01 — End: 2022-01-02
  Administered 2022-01-01 – 2022-01-02 (×4): 3 mL via RESPIRATORY_TRACT
  Filled 2022-01-01 (×3): qty 3

## 2022-01-01 MED ORDER — ENSURE ENLIVE PO LIQD
237.0000 mL | Freq: Two times a day (BID) | ORAL | Status: DC
  Administered 2022-01-02 – 2022-01-03 (×4): 237 mL via ORAL

## 2022-01-01 MED ORDER — ATORVASTATIN CALCIUM 20 MG PO TABS
20.0000 mg | ORAL_TABLET | Freq: Every day | ORAL | Status: DC
  Administered 2022-01-02 – 2022-01-03 (×2): 20 mg via ORAL
  Filled 2022-01-01 (×2): qty 1

## 2022-01-01 MED ORDER — HALOPERIDOL LACTATE 5 MG/ML IJ SOLN
2.0000 mg | Freq: Once | INTRAMUSCULAR | Status: AC
  Administered 2022-01-01: 2 mg via INTRAVENOUS
  Filled 2022-01-01: qty 1

## 2022-01-01 MED ORDER — SODIUM CHLORIDE 0.9 % IV SOLN
2.0000 g | Freq: Once | INTRAVENOUS | Status: AC
Start: 2022-01-01 — End: 2022-01-01
  Administered 2022-01-01: 2 g via INTRAVENOUS
  Filled 2022-01-01: qty 20

## 2022-01-01 MED ORDER — ACETAMINOPHEN 325 MG PO TABS
650.0000 mg | ORAL_TABLET | Freq: Four times a day (QID) | ORAL | Status: DC | PRN
Start: 2022-01-01 — End: 2022-01-03

## 2022-01-01 MED ORDER — LACTATED RINGERS IV BOLUS
1000.0000 mL | Freq: Once | INTRAVENOUS | Status: AC
  Administered 2022-01-01: 1000 mL via INTRAVENOUS

## 2022-01-01 MED ORDER — SODIUM CHLORIDE 0.9 % IV SOLN
500.0000 mg | Freq: Once | INTRAVENOUS | Status: AC
  Administered 2022-01-01: 500 mg via INTRAVENOUS
  Filled 2022-01-01: qty 5

## 2022-01-01 MED ORDER — SENNOSIDES-DOCUSATE SODIUM 8.6-50 MG PO TABS
2.0000 | ORAL_TABLET | Freq: Every day | ORAL | Status: DC
  Administered 2022-01-02 – 2022-01-03 (×2): 2 via ORAL
  Filled 2022-01-01 (×2): qty 2

## 2022-01-01 MED ORDER — PAROXETINE HCL 30 MG PO TABS
30.0000 mg | ORAL_TABLET | Freq: Every day | ORAL | Status: DC
  Administered 2022-01-02 – 2022-01-03 (×2): 30 mg via ORAL
  Filled 2022-01-01 (×2): qty 1

## 2022-01-01 MED ORDER — DIVALPROEX SODIUM 125 MG PO CSDR
250.0000 mg | DELAYED_RELEASE_CAPSULE | Freq: Two times a day (BID) | ORAL | Status: DC
  Administered 2022-01-01 – 2022-01-03 (×4): 250 mg via ORAL
  Filled 2022-01-01 (×7): qty 2

## 2022-01-01 MED ORDER — PANTOPRAZOLE SODIUM 40 MG PO TBEC
40.0000 mg | DELAYED_RELEASE_TABLET | Freq: Every day | ORAL | Status: DC
Start: 2022-01-02 — End: 2022-01-03
  Administered 2022-01-02 – 2022-01-03 (×2): 40 mg via ORAL
  Filled 2022-01-01 (×2): qty 1

## 2022-01-01 MED ORDER — ACETAMINOPHEN 650 MG RE SUPP
650.0000 mg | Freq: Four times a day (QID) | RECTAL | Status: DC | PRN
Start: 2022-01-01 — End: 2022-01-03

## 2022-01-01 MED ORDER — OXYCODONE-ACETAMINOPHEN 5-325 MG PO TABS: 1.0000 | ORAL_TABLET | ORAL | Status: DC | PRN

## 2022-01-01 MED ORDER — ALBUTEROL SULFATE (2.5 MG/3ML) 0.083% IN NEBU: 2.5000 mg | INHALATION_SOLUTION | RESPIRATORY_TRACT | Status: DC | PRN

## 2022-01-01 MED ORDER — MELATONIN 5 MG PO TABS
10.0000 mg | ORAL_TABLET | Freq: Every day | ORAL | Status: DC
  Administered 2022-01-01 – 2022-01-02 (×2): 10 mg via ORAL
  Filled 2022-01-01 (×2): qty 2

## 2022-01-01 MED ORDER — SODIUM CHLORIDE 0.9 % IV SOLN: INTRAVENOUS | Status: DC

## 2022-01-01 MED ORDER — DM-GUAIFENESIN ER 30-600 MG PO TB12: 1.0000 | ORAL_TABLET | Freq: Two times a day (BID) | ORAL | Status: DC | PRN

## 2022-01-01 MED ORDER — ENOXAPARIN SODIUM 30 MG/0.3ML IJ SOSY
30.0000 mg | PREFILLED_SYRINGE | INTRAMUSCULAR | Status: DC
  Administered 2022-01-01 – 2022-01-02 (×2): 30 mg via SUBCUTANEOUS
  Filled 2022-01-01 (×2): qty 0.3

## 2022-01-01 MED ORDER — ALPRAZOLAM 0.25 MG PO TABS
0.2500 mg | ORAL_TABLET | Freq: Two times a day (BID) | ORAL | Status: DC | PRN
  Administered 2022-01-02: 0.25 mg via ORAL
  Filled 2022-01-01 (×2): qty 1

## 2022-01-01 MED ORDER — SODIUM CHLORIDE 0.9 % IV BOLUS
1000.0000 mL | Freq: Once | INTRAVENOUS | Status: AC
  Administered 2022-01-01: 1000 mL via INTRAVENOUS

## 2022-01-01 MED ORDER — ONDANSETRON HCL 4 MG/2ML IJ SOLN: 4.0000 mg | Freq: Three times a day (TID) | INTRAMUSCULAR | Status: DC | PRN

## 2022-01-01 NOTE — Progress Notes (Signed)
Civil engineer, contracting (ACC) ? ?Shirley Villanueva is a current hospice patient with a terminal diagnosis of Lewy Body dementia. She was found to be hypoxic at her facility with worsening confusion and the decision was made to send her to the ED. ? ?APS has been notified of her transfer to the ED. Theodis Shove is her APS SW (number correct on facesheet), she is off today but the on call SW has been notified of her transport to the ED.  ? ?Faye Ramsay, granddaughter, has also been notified of her transfer to the ED (listed as [place] on the facesheet). ? ?She is a full code since she has been a hospice patient. ? ?Decisions for her care are deferred to her APS worker. ? ?Thank you, ?Wallis Bamberg BSN, RN ?[facility] Liaison  ?

## 2022-01-01 NOTE — H&P (Signed)
?History and Physical  ? ? ?Shirley Villanueva NWG:[id number] DOB: [birth year] DOA: 01/01/2022 ? ?Referring MD/NP/PA:  ? ?PCP: Vinnie LevelWilliams, Julonda N, NP  ? ?Patient coming from:  The patient is coming from SNF ? ?Chief Complaint: SOB and AMS ? ?HPI: Shirley FieldBetty V Schara is a 88 y.o. 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 ? ?Patient has Lewy body dementia with aphasia, and worsening mental status, is unable to provide accurate medical history, therefore, most of the history is obtained by discussing the case with ED physician, per EMS report, and with the nursing staff. I also called her granddaughter to have collected some inflammations.  The history is still limited. ? ?Patient was recently hospitalized from 3/20 - 3/24 due to COVID infection.  Per her granddaughter, patient was not wearing oxygen before COVID infection. Pt seems to be on 2L of O2 after COVID infection in facility.  Per report, patient was found to have shortness of breath with oxygen desaturating to 81% on room air in facilty. Initially patient was started on nonrebreather, then decreased to 4L of oxygen with saturation 98%. ? ?Per her granddaughter, before COVID infection, patient was talkative and more alert, but today patient has worsening mental status, less talkative and less alert.  At her normal baseline, patient is not oriented x3.  Patient moves all extremities.  Patient does not have active cough, nausea, vomiting, diarrhea.  Does not seem to have pain anywhere.  She moves all extremities.  No facial droop.  Not sure if patient has symptoms of UTI. ? ?Data Reviewed and ED Course: pt was found to have WBC 11.1, lactic acid of 4.1, 2.6, BNP 120, renal function stable, temperature normal, blood pressure 191/33, 140/73,  heart rate 30s-40s, RR 16, chest x-ray negative.  CT of chest is negative for acute issues, but showed reactive posterior eighth rib fracture.  ? ? ?CT-chest wo contrast: ?No active cardiopulmonary disease. Scattered benign appearing ?pulmonary scarring. No consolidation, collapse or effusion. ?  ?Aortic atherosclerosis.  Coronary artery calcification. ?  ?Acute or subacute fracture of the right posterior eighth rib. Exact ?age unknown. ? ? ? ?EKG:  Not done in ED, will get one.   ? ? ?Review of Systems: Could not be reviewed due to dementia and aphasia and worsening mental status. ? ?Allergy: No Known Allergies ? ?Past Medical History:  ?Diagnosis Date  ? Aphasia   ? COPD (chronic obstructive pulmonary disease) (HCC)   ? Lack of coordination   ? Lewy body dementia (HCC)   ? ? ?Past Surgical History:  ?Procedure Laterality Date  ? CESAREAN SECTION    ? CHOLECYSTECTOMY    ? ? ?Social History:  reports that she has never smoked. She has never used smokeless tobacco. She reports that she does not drink alcohol and does not use drugs. ? ?Family History: Could not be reviewed due to dementia and aphasia, and worsening mental status. ? ?Prior to Admission medications   ?Medication Sig Start Date End Date Taking? Authorizing Provider  ?ALPRAZolam (XANAX) 0.25 MG tablet Take 1 tablet (0.25 mg total) by mouth 2 (two) times daily as needed. 12/16/21  Yes Marrion CoyZhang, Dekui, MD  ?atorvastatin (LIPITOR) 20 MG tablet Take 20 mg by mouth daily. 08/18/19  Yes [provider]  ?divalproex (DEPAKOTE SPRINKLE) 125 MG capsule  Take 250 mg by mouth 2 (two) times daily. 12/09/20  Yes [provider]  ?melatonin 5 MG TABS Take 10 mg by mouth at bedtime.   Yes [provider]  ?omeprazole (PRILOSEC) 20 MG capsule Take 20 mg by mouth daily. 08/18/19  Yes [provider]  ?PARoxetine (PAXIL) 30 MG tablet Take 30 mg by mouth daily. 08/18/19  Yes [provider]  ?senna-docusate (SENOKOT-S) 8.6-50 MG tablet  Take 2 tablets by mouth daily.   Yes [provider]  ?calcium carbonate (OS-CAL - DOSED IN MG OF ELEMENTAL CALCIUM) 1250 (500 Ca) MG tablet Take 1 tablet by mouth. ?Patient not taking: Reported on 01/01/2022    [provider]  ? ? ?Physical Exam: ?Vitals:  ? 01/01/22 1330 01/01/22 1400 01/01/22 1430 01/01/22 1500  ?BP: (!) 133/99 (!) 159/53 (!) 190/148 (!) 197/49  ?Pulse: (!) 43 (!) 37 (!) 42 (!) 40  ?Resp: 13 13 11 16   ?Temp:    97.9 ?F (36.6 ?C)  ?TempSrc:    Axillary  ?SpO2: 100% 100% 100% 100%  ?Weight:      ?Height:      ? ?General: Not in acute distress.  Thin body habitus.  Dry mucous membrane ?HEENT: ?      Eyes: PERRL, EOMI, no scleral icterus. ?      ENT: No discharge from the ears and nose. ?      Neck: No JVD, no bruit, no mass felt. ?Heme: No neck lymph node enlargement. ?Cardiac: S1/S2, RRR, No murmurs, No gallops or rubs. ?Respiratory: Mild rhonchi bilaterally ?GI: Soft, nondistended, nontender, no organomegaly, BS present. ?GU: No hematuria ?Ext: No pitting leg edema bilaterally. 1+DP/PT pulse bilaterally. ?Musculoskeletal: No joint deformities, No joint redness or warmth, no limitation of ROM in spin. ?Skin: No rashes.  ?Neuro: Confused, aphasia, not following command, not oriented x3, cranial nerves II-XII grossly intact, moves all extremities. ?Psych: Patient is not psychotic, no suicidal or hemocidal ideation. ? ?Labs on Admission: I have personally reviewed following labs and imaging studies ? ?CBC: ?Recent Labs  ?Lab 01/01/22 ?0928  ?WBC 11.1*  ?NEUTROABS 9.4*  ?HGB 12.8  ?HCT 41.3  ?MCV 93.9  ?PLT 251  ? ?Basic Metabolic Panel: ?Recent Labs  ?Lab 01/01/22 ?0928  ?NA 139  ?K 4.2  ?CL 106  ?CO2 26  ?GLUCOSE 195*  ?BUN 36*  ?CREATININE 1.39*  ?CALCIUM 8.8*  ? ?GFR: ?Estimated Creatinine Clearance: 21.3 mL/min (A) (by C-G formula based on SCr of 1.39 mg/dL (H)). ?Liver Function Tests: ?Recent Labs  ?Lab 01/01/22 ?0928  ?AST 24  ?ALT 15  ?ALKPHOS 66  ?BILITOT 1.1  ?PROT 5.2*   ?ALBUMIN 3.0*  ? ?No results for input(s): LIPASE, AMYLASE in the last 168 hours. ?No results for input(s): AMMONIA in the last 168 hours. ?Coagulation Profile: ?No results for input(s): INR, PROTIME in the last 168 hours. ?Cardiac Enzymes: ?No results for input(s): CKTOTAL, CKMB, CKMBINDEX, TROPONINI in the last 168 hours. ?BNP (last 3 results) ?No results for input(s): PROBNP in the last 8760 hours. ?HbA1C: ?No results for input(s): HGBA1C in the last 72 hours. ?CBG: ?No results for input(s): GLUCAP in the last 168 hours. ?Lipid Profile: ?No results for input(s): CHOL, HDL, LDLCALC, TRIG, CHOLHDL, LDLDIRECT in the last 72 hours. ?Thyroid Function Tests: ?No results for input(s): TSH, T4TOTAL, FREET4, T3FREE, THYROIDAB in the last 72 hours. ?Anemia Panel: ?No results for input(s): VITAMINB12, FOLATE, FERRITIN, TIBC, IRON, RETICCTPCT in the last 72 hours. ?Urine analysis: ?   ?  Component Value Date/Time  ? COLORURINE AMBER (A) 12/12/2021 1519  ? APPEARANCEUR CLOUDY (A) 12/12/2021 1519  ? APPEARANCEUR Clear 05/26/2013 1604  ? LABSPEC 1.021 12/12/2021 1519  ? LABSPEC 1.017 05/26/2013 1604  ? PHURINE 5.0 12/12/2021 1519  ? GLUCOSEU NEGATIVE 12/12/2021 1519  ? GLUCOSEU Negative 05/26/2013 1604  ? HGBUR NEGATIVE 12/12/2021 1519  ? [place] NEGATIVE 12/12/2021 1519  ? BILIRUBINUR Negative 05/26/2013 1604  ? KETONESUR 5 (A) 12/12/2021 1519  ? PROTEINUR 100 (A) 12/12/2021 1519  ? NITRITE NEGATIVE 12/12/2021 1519  ? LEUKOCYTESUR MODERATE (A) 12/12/2021 1519  ? LEUKOCYTESUR Negative 05/26/2013 1604  ? ?Sepsis Labs: ?@LABRCNTIP (procalcitonin:4,lacticidven:4) ?)No results found for this or any previous visit (from the past 240 hour(s)).  ? ?Radiological Exams on Admission: ?CT CHEST WO CONTRAST ? ?Result Date: 01/01/2022 ?CLINICAL DATA:  Respiratory illness. Nondiagnostic x-ray. Hypoxia. Dementia and agitation. EXAM: CT CHEST WITHOUT CONTRAST TECHNIQUE: Multidetector CT imaging of the chest was performed following the standard  protocol without IV contrast. RADIATION DOSE REDUCTION: This exam was performed according to the departmental dose-optimization program which includes automated exposure control, adjustment of the mA and/

## 2022-01-01 NOTE — ED Provider Notes (Signed)
? ?[facility] ?Provider Note ? ? ? Event Date/Time  ? First MD Initiated Contact with Patient 01/01/22 1123   ?  (approximate) ? ? ?History  ? ?hypoxia ? ? ?HPI ? ?Shirley Villanueva is a 88 y.o. female with a history of dementia, CKD, type II AV block with bradycardia, COPD who is sent to the ED today due to hypoxia.  She is not normally on supplemental oxygen.  At her facility today they noted her oxygenation to be 81% on room air.  They also felt that she was more confused than normal. ? ?Patient is unable to provide any meaningful history. ?  ? ? ?Physical Exam  ? ?Triage Vital Signs: ?ED Triage Vitals  ?Enc Vitals Group  ?   BP 01/01/22 0932 (!) 156/47  ?   Pulse Rate 01/01/22 0923 100  ?   Resp 01/01/22 0923 16  ?   Temp 01/01/22 0923 (!) 97 ?F (36.1 ?C)  ?   Temp Source 01/01/22 0923 Axillary  ?   SpO2 01/01/22 0923 100 %  ?   Weight 01/01/22 0929 130 lb 15.3 oz (59.4 kg)  ?   Height 01/01/22 0929 5' (1.524 m)  ?   Head Circumference --   ?   Peak Flow --   ?   Pain Score --   ?   Pain Loc --   ?   Pain Edu? --   ?   Excl. in GC? --   ? ? ?Most recent vital signs: ?Vitals:  ? 01/01/22 1030 01/01/22 1100  ?BP: (!) 163/47 (!) 191/33  ?Pulse: 75 (!) 36  ?Resp: 12 14  ?Temp:    ?SpO2: 98% 100%  ? ? ? ?General: Awake, no distress.  ?CV:  Good peripheral perfusion.  Bradycardia, heart rate 40-60 ?Resp:  Normal effort.  Symmetric air movement.  No wheezing.  Normal expiratory phase. ?Abd:  No distention.  Soft and nontender ?Other:  No rash or inflammatory skin changes.  Dry mucous membranes ? ? ?ED Results / Procedures / Treatments  ? ?Labs ?(all labs ordered are listed, but only abnormal results are displayed) ?Labs Reviewed  ?LACTIC ACID, PLASMA - Abnormal; Notable for the following components:  ?    Result Value  ? Lactic Acid, Venous 4.1 (*)   ? All other components within normal limits  ?COMPREHENSIVE METABOLIC PANEL - Abnormal; Notable for the following components:  ? Glucose, Bld 195 (*)    ? BUN 36 (*)   ? Creatinine, Ser 1.39 (*)   ? Calcium 8.8 (*)   ? Total Protein 5.2 (*)   ? Albumin 3.0 (*)   ? GFR, Estimated 36 (*)   ? All other components within normal limits  ?CBC WITH DIFFERENTIAL/PLATELET - Abnormal; Notable for the following components:  ? WBC 11.1 (*)   ? RDW 15.6 (*)   ? Neutro Abs 9.4 (*)   ? Abs Immature Granulocytes 0.09 (*)   ? All other components within normal limits  ?CULTURE, BLOOD (ROUTINE X 2)  ?CULTURE, BLOOD (ROUTINE X 2)  ?URINE CULTURE  ?LACTIC ACID, PLASMA  ?URINALYSIS, COMPLETE (UACMP) WITH MICROSCOPIC  ? ? ? ?EKG ? ? ? ? ?RADIOLOGY ?Chest x-ray viewed and interpreted by me, appears unremarkable.  Radiology report reviewed. ? ?CT chest without contrast pending ? ? ? ?PROCEDURES: ? ?Critical Care performed: Yes, see critical care procedure note(s) ? ?.Critical Care ?Performed by: Sharman Cheek, MD ?Authorized by: Sharman Cheek, MD  ? ?  Critical care provider statement:  ?  Critical care time (minutes):  35 ?  Critical care time was exclusive of:  Separately billable procedures and treating other patients ?  Critical care was necessary to treat or prevent imminent or life-threatening deterioration of the following conditions:  Respiratory failure and shock ?  Critical care was time spent personally by me on the following activities:  Development of treatment plan with patient or surrogate, discussions with consultants, evaluation of patient's response to treatment, examination of patient, obtaining history from patient or surrogate, ordering and performing treatments and interventions, ordering and review of laboratory studies, ordering and review of radiographic studies, pulse oximetry, re-evaluation of patient's condition and review of old charts ?  Care discussed with: admitting provider   ? ? ?MEDICATIONS ORDERED IN ED: ?Medications  ?azithromycin (ZITHROMAX) 500 mg in sodium chloride 0.9 % 250 mL IVPB (500 mg Intravenous New Bag/Given 01/01/22 1106)  ?lactated  ringers bolus 1,000 mL (has no administration in time range)  ?cefTRIAXone (ROCEPHIN) 2 g in sodium chloride 0.9 % 100 mL IVPB (0 g Intravenous Stopped 01/01/22 1039)  ?sodium chloride 0.9 % bolus 1,000 mL (0 mLs Intravenous Stopped 01/01/22 1056)  ?haloperidol lactate (HALDOL) injection 2 mg (2 mg Intravenous Given 01/01/22 0958)  ? ? ? ?IMPRESSION / MDM / ASSESSMENT AND PLAN / ED COURSE  ?I reviewed the triage vital signs and the nursing notes. ?             ?               ? ?Differential diagnosis includes, but is not limited to, pneumonia, sepsis, pleural effusion, pulmonary edema, UTI, dehydration, electrolyte abnormality ? ?Patient presents with hypoxia, stabilized on nasal cannula oxygen.  She is bradycardic which appears to be chronic for her.  Temperature appears normal, no hypotension. ? ?Labs show stable CKD.  CBC is unremarkable.  Lactate is 4.1 concerning for septic shock.  2 L IV fluid bolus ordered for 30 mL/kg resuscitation.  Due to agitation, patient required 2 mg IV Haldol. ? ? ?Clinical Course as of 01/01/22 1134  ?Wynelle Link Jan 01, 2022  ?1015 Unable to obtain blood cultures due to poor IV access.  Patient is receiving IV fluids and antibiotics for sepsis treatment.  Lactate is 4, will continue IV fluid boluses. [PS]  ?  ?Clinical Course User Index ?[PS] Sharman Cheek, MD  ? ? ? ?FINAL CLINICAL IMPRESSION(S) / ED DIAGNOSES  ? ?Final diagnoses:  ?Acute respiratory failure with hypoxia (HCC)  ?Severe dementia, unspecified dementia type, unspecified whether behavioral, psychotic, or mood disturbance or anxiety (HCC)  ? ? ? ?Rx / DC Orders  ? ?ED Discharge Orders   ? ? None  ? ?  ? ? ? ?Note:  This document was prepared using Dragon voice recognition software and may include unintentional dictation errors. ?  ?Sharman Cheek, MD ?01/01/22 1142 ? ?

## 2022-01-01 NOTE — ED Notes (Signed)
Second IV placed, blood return is very sluggish and unable to get enough blood for cultures. EDP made aware.  ?

## 2022-01-01 NOTE — Progress Notes (Signed)
Social worker (?) Sharlyne Cai called at shift change, wants a day shift MD to call her back to discuss patient's code status. [phone]. ?

## 2022-01-01 NOTE — ED Notes (Signed)
Pt pulling oxygen off face. Pt has removed one mitt. Slow A fib on monitor with HR 30s-40s. EDP aware. Xray at bedside. ?

## 2022-01-01 NOTE — ED Notes (Signed)
Pt is very restless and will not lay still. Mitts placed for patient safety. Md made aware to order some haldol.  ?

## 2022-01-01 NOTE — ED Notes (Signed)
[name] called back. They will call the director to discuss code option for the patient. And call RN back. ?

## 2022-01-01 NOTE — Progress Notes (Signed)
[facility] [address] [facility]) Hospitalized Hospice Patient ? ?Shirley Villanueva is a current hospice patient with a terminal diagnosis of Lewy Body dementia. She was found to be hypoxic, with worsening mentation and the decision was made to send her to the ED for evaluation. Hospice was notified prior to her transfer. She is admitted for septic shock, currently unknown source. This is a related hospital admission per Dr. Barbee Shropshire, [facility] MD. ? ?Pt is alert but demented, not clear her baseline level of consciousness. She is agitated and required safety mitts for her protection. She is under the care of APS, they have been notified that she is admitted. Her granddaughter has been notified as well and is in transit to be with her.  ? ?V/S: 97.9, 197/90, HR 40, RR 16, SPO2 100% on 4 lpm via [place] ?I&O: 2000/ none recorded ?Labs: BUN 36, Cr 1.39, albumin 3.0, gfr 36, BNP 120, lactic acid 4.1, WBC 11.1 ?Diagnostics: CT chest - unremarkable; CXR - unremarkable ?IV/PRN: LR 1L bolus IV x 1, NS 1L bolus IV x 1, haldol 2 mg IV x 1 for agitation, zithromax 500 mg IV x 1, rocephin 2 g IV x 1 ? ?Patient is inpatient appropriate due to need for IVF and IV abx for sepsis.  ? ?Problem List: ?- sepsis - source unclear, LA 4.1, hypotensive on arrival, rocephin, zithromax and IVF  ?- hypoxia - CT and CXR unremarkable, SPO2 100% on O2 ?- Lewy Body dementia - under hospice services for this ? ?GOC: not clear, pt is under APS protection, currently a full code ?Family: not at bedside, no option to leave VM; updated APS Shanda Bumps SW, as well as her on-call back up coverage ?D/C planning: ongoing ?IDT: hospice team updated ? ?Thank you, ?Wallis Bamberg BSN, RN ?[facility] Liaison  ? ? ?

## 2022-01-01 NOTE — ED Notes (Addendum)
RN Paged [name] to have them call to confirm code status at request of MD Niu.  ?Grand-daughter at bedside. Name of Deneise Lever  ?[phone] ?

## 2022-01-01 NOTE — ED Triage Notes (Signed)
BIB EMS from [facility] for hypoxia. Pt is full code. Hospice care. Pt oxygen was 81% on RA at facility. EMS placed on NRB. EMS advised "pt has a foul smell" and activated code sepsis. Pt has been rapidly declining in her mental status. Does have dementia. ?96.5 Axillary ?250 CBG ?27RR ?21 ETCO2 ?114/51 ? ?

## 2022-01-01 NOTE — ED Notes (Signed)
Pt is still restless at this time.  ?

## 2022-01-02 ENCOUNTER — Encounter: Payer: Self-pay | Admitting: Internal Medicine

## 2022-01-02 ENCOUNTER — Inpatient Hospital Stay

## 2022-01-02 ENCOUNTER — Inpatient Hospital Stay (HOSPITAL_COMMUNITY)
Admit: 2022-01-02 | Discharge: 2022-01-02 | Disposition: A | Discharge status: Home / Self Care | Attending: Internal Medicine | Admitting: Internal Medicine

## 2022-01-02 DIAGNOSIS — F0283 Dementia in other diseases classified elsewhere, unspecified severity, with mood disturbance: Secondary | ICD-10-CM

## 2022-01-02 DIAGNOSIS — G3183 Dementia with Lewy bodies: Secondary | ICD-10-CM

## 2022-01-02 DIAGNOSIS — I272 Pulmonary hypertension, unspecified: Secondary | ICD-10-CM

## 2022-01-02 LAB — BASIC METABOLIC PANEL
Anion gap: 8 (ref 5–15)
BUN: 30 mg/dL — ABNORMAL HIGH (ref 8–23)
CO2: 23 mmol/L (ref 22–32)
Calcium: 7.9 mg/dL — ABNORMAL LOW (ref 8.9–10.3)
Chloride: 112 mmol/L — ABNORMAL HIGH (ref 98–111)
Creatinine, Ser: 1.04 mg/dL — ABNORMAL HIGH (ref 0.44–1.00)
GFR, Estimated: 51 mL/min — ABNORMAL LOW (ref >60)
Glucose, Bld: 79 mg/dL (ref 70–99)
Potassium: 3.9 mmol/L (ref 3.5–5.1)
Sodium: 143 mmol/L (ref 135–145)

## 2022-01-02 LAB — ECHOCARDIOGRAM COMPLETE
AR max vel: 2.31 cm2
AV Area VTI: 2.35 cm2
AV Area mean vel: 2.07 cm2
AV Mean grad: 6 mmHg
AV Peak grad: 11.2 mmHg
Ao pk vel: 1.67 m/s
Area-P 1/2: 2.1 cm2
Height: 60 in
MV VTI: 2.73 cm2
S' Lateral: 2.63 cm
Weight: 2095.25 oz

## 2022-01-02 LAB — CBC
HCT: 30.7 % — ABNORMAL LOW (ref 36.0–46.0)
Hemoglobin: 9.7 g/dL — ABNORMAL LOW (ref 12.0–15.0)
MCH: 29.3 pg (ref 26.0–34.0)
MCHC: 31.6 g/dL (ref 30.0–36.0)
MCV: 92.7 fL (ref 80.0–100.0)
Platelets: 148 10*3/uL — ABNORMAL LOW (ref 150–400)
RBC: 3.31 MIL/uL — ABNORMAL LOW (ref 3.87–5.11)
RDW: 15.7 % — ABNORMAL HIGH (ref 11.5–15.5)
WBC: 7.4 10*3/uL (ref 4.0–10.5)
nRBC: 0 % (ref 0.0–0.2)

## 2022-01-02 LAB — PROCALCITONIN: Procalcitonin: <0.1 ng/mL

## 2022-01-02 MED ORDER — LORAZEPAM 2 MG/ML IJ SOLN
0.5000 mg | Freq: Once | INTRAMUSCULAR | Status: AC
  Administered 2022-01-02: 0.5 mg via INTRAVENOUS
  Filled 2022-01-02: qty 1

## 2022-01-02 MED ORDER — ADULT MULTIVITAMIN W/MINERALS CH
1.0000 | ORAL_TABLET | Freq: Every day | ORAL | Status: DC
  Administered 2022-01-02 – 2022-01-03 (×2): 1 via ORAL
  Filled 2022-01-02 (×2): qty 1

## 2022-01-02 MED ORDER — PREDNISONE 20 MG PO TABS
20.0000 mg | ORAL_TABLET | Freq: Every day | ORAL | Status: DC
Start: 2022-01-02 — End: 2022-01-03
  Administered 2022-01-02 – 2022-01-03 (×2): 20 mg via ORAL
  Filled 2022-01-02: qty 1

## 2022-01-02 MED ORDER — IOHEXOL 350 MG/ML SOLN
50.0000 mL | Freq: Once | INTRAVENOUS | Status: AC | PRN
  Administered 2022-01-02: 50 mL via INTRAVENOUS

## 2022-01-02 MED ORDER — IPRATROPIUM-ALBUTEROL 0.5-2.5 (3) MG/3ML IN SOLN
3.0000 mL | Freq: Four times a day (QID) | RESPIRATORY_TRACT | Status: DC
Start: 2022-01-02 — End: 2022-01-02
  Administered 2022-01-02: 3 mL via RESPIRATORY_TRACT
  Filled 2022-01-02: qty 3

## 2022-01-02 NOTE — Progress Notes (Addendum)
?  Progress Note ? ? ?Patient: Shirley Villanueva [id number] DOB: Oct 11, 1927 DOA: 01/01/2022     1 ?DOS: the patient was seen and examined on 01/02/2022 ?  ?Brief hospital course: ?Shirley Villanueva is a 88 y.o. 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 ? ?Assessment and Plan: ?Acute hypoxemic respiratory failure  ?Pulmonary hypertension most likely severe. ?COPD exacerbation. POA ?Reviewed her prior echocardiogram, she has a moderate pulm hypertension more than 2 years ago, currently, she probably has severe pulm hypertension.  We will repeat echocardiogram to confirm.  Patient has a mild exacerbation of her COPD.  We will start prednisone 20 mg daily. ?BNP not significantly elevated. ? ?Lewy body dementia. ?Acute metabolic cephalopathy. ?Patient still confused with occasional agitation. ? ?Sinus bradycardia with Mobitz type II AV block. ?Chronic diastolic congestive heart failure. ?No intervention due to poor prognosis. ? ?Severe protein calorie malnutrition. ?Continue protein supplements. ? ?Lactic acidosis. ?Patient does not have acute infection, procalcitonin level less than 0.1, condition improved after giving fluids. ? ?Right rib fracture pain ?As needed pain medicine ? ?Chronic kidney disease stage IIIa. ?Renal function stable. ? ?Prognosis. ?Patient has advanced age with Lewy body dementia, prognosis is very poor.  Currently patient is still full code, palliative care is talking to adult protection agency try to change CODE STATUS to DO NOT RESUSCITATE.  Patient most likely will discharge back to nursing home with hospice. ?In my opinion, resuscitation in this patient is futile, I will recommend do not resuscitate status. ? ?  ? ?Subjective:  ?Patient is confused, occasionally  agitated.  Poor appetite, does not seem to be showing short of breath ? ?Physical Exam: ?Vitals:  ? 01/01/22 2001 01/01/22 2347 01/02/22 0359 01/02/22 [id number]  ?BP:  (!) 146/46 139/60 (!) 163/85  ?Pulse:  (!) 41 (!) 46 62  ?Resp:  14 18 17   ?Temp:  98 ?F (36.7 ?C) 98.2 ?F (36.8 ?C) 98 ?F (36.7 ?C)  ?TempSrc:      ?SpO2: 91% 96%  91%  ?Weight:      ?Height:      ? ?General exam: Ill-appearing, cachectic. ?Respiratory system: Decreased breathing sounds. Respiratory effort normal. ?Cardiovascular system: S1 & S2 heard, RRR. No JVD, murmurs, rubs, gallops or clicks. No pedal edema. ?Gastrointestinal system: Abdomen is nondistended, soft and nontender. No organomegaly or masses felt. Normal bowel sounds heard. ?Central nervous system: Alert and confused. No focal neurological deficits. ?Extremities: Symmetric 5 x 5 power. ?Skin: No rashes, lesions or ulcers ? ? ?Data Reviewed: ? ?Prior echocardiogram, chest x-ray, lab results. ? ?Family Communication: APS is patient POA. ? ?Disposition: ?Status is: Inpatient ?Remains inpatient appropriate because: Severity of disease. ? Planned Discharge Destination:  Nursing home with hospice ? ? ? ?Time spent: 35 minutes ? ?Author: ?Sharen Hones, MD ?01/02/2022 11:46 AM ? ?For on call review [email or web address].  ?

## 2022-01-02 NOTE — TOC Initial Note (Signed)
Transition of Care (TOC) - Initial/Assessment Note  ? ? ?Patient Details  ?Name: Shirley Villanueva ?MRN: [id number] ?Date of Birth: [birth year] ? ?Transition of Care (TOC) CM/SW Contact:    ?Alberteen Sam, LCSW ?Phone Number: ?01/02/2022, 9:51 AM ? ?Clinical Narrative:                 ? ?CSW notes patient is from [facility] facility with [facility].  ? ?Patient has [name] involvement with case worker Earl Lagos.  ? ?CSW notes treatment team attempts to contact [name] regarding patient's full code status.  ? ?TOC will follow for additional needs, at this time plan is to return to [facility] with hospice.  ? ?Expected Discharge Plan: [place] ?Barriers to Discharge: Continued Medical Work up ? ? ?Patient Goals and CMS Choice ?Patient states their goals for this hospitalization and ongoing recovery are:: to go home ?CMS Medicare.gov Compare Post Acute Care list provided to:: Patient Represenative (must comment) (APS) ?Choice offered to / list presented to : [facility] POA / Guardian ? ?Expected Discharge Plan and Services ?Expected Discharge Plan: [place] ?  ?  ?  ?Living arrangements for the past 2 months:  ([name] with [place]) ?                ?  ?  ?  ?  ?  ?  ?  ?  ?  ?  ? ?Prior Living Arrangements/Services ?Living arrangements for the past 2 months:  ([name] with [employer]) ?  ?  ?       ?  ?  ?  ?  ? ?Activities of Daily Living ?  ?  ? ?Permission Sought/Granted ?  ?  ?   ?   ?   ?   ? ?Emotional Assessment ?  ?  ?  ?  ?  ?  ? ?Admission diagnosis:  Acute respiratory failure with hypoxia ([place]) [J96.01] ?Severe dementia, unspecified dementia type, unspecified whether behavioral, psychotic, or mood disturbance or anxiety ([place]) [F03.C0] ?Patient Active Problem List  ? Diagnosis Date Noted  ? Acute respiratory failure with hypoxia ([place]) 01/01/2022  ? Elevated lactic acid level 01/01/2022  ? Right rib fracture 01/01/2022  ? Chronic kidney disease, stage 3a ([place])  01/01/2022  ? Chronic diastolic CHF (congestive heart failure) ([place]) 01/01/2022  ? Protein-calorie malnutrition, severe ([place]) 12/16/2021  ? AV block, Mobitz 2 12/15/2021  ? Failure to thrive in adult 12/14/2021  ? Sinus bradycardia 12/13/2021  ? Thrush 12/13/2021  ? COVID-19 virus infection 12/12/2021  ? Hypernatremia 12/12/2021  ? Hyperkalemia 12/12/2021  ? Acute renal failure superimposed on stage 3a chronic kidney disease ([place]) 12/12/2021  ? Acute metabolic encephalopathy 0000000  ? TIA (transient ischemic attack) 12/31/2020  ? CKD (chronic kidney disease), stage IIIa 12/31/2020  ? Depression with anxiety 12/31/2020  ? HLD (hyperlipidemia) 08/31/2019  ? Sepsis ([place]) 08/31/2019  ? E. coli UTI 08/31/2019  ? Lewy body dementia ([place])   ? COPD (chronic obstructive pulmonary disease) ([place])   ? Hypothermia   ? Cold exposure   ? Elevated troponin   ? Leucocytosis   ? ?PCP:  Haynes Hoehn, NP ?Pharmacy:   ?[facility] - [place], [place] - [id number] ?[address] ?[address] ?[place] [address] ?Phone: [phone] Fax: [phone] ? ? ? ? ?Social Determinants of Health (SDOH) Interventions ?  ? ?Readmission Risk Interventions ?   ? View : No  data to display.  ?  ?  ?  ? ? ? ?

## 2022-01-02 NOTE — Progress Notes (Signed)
[facility] [address] [facility]) Hospitalized Hospice Patient ?  ?Shirley Villanueva is a current hospice patient with a terminal diagnosis of Lewy Body dementia. She was found to be hypoxic, with worsening mentation and the decision was made to send her to the ED for evaluation. Hospice was notified prior to her transfer. She is admitted for septic shock, currently unknown source. This is a related hospital admission per Dr. Tomasa Hosteller, [facility] MD. ?  ?Pt is nonresponsive to my voice and per NT at bedside has been difficult to arouse this morning. She remains agitated and continues to require safety mitts for her protection. She is under the care of [place] [name]. PMT Provider/Mary L.,NP reached out to MSW this morning requesting that [place] [facility] have a [place] conversation with Ms. Bekker guardian rep/Jessica ([name]) regarding code status changing to a DNR. Janett Billow states that for this to occur, they would need written documentation from a hospital provider stating that the medical recommendation is a DNR. MSW requested documentation from attending MD/Dr. Roosevelt Locks and MD reports that this information has been added to his note. [facility] medical record team will facilitate sending records to [name] from this point forward. HLT to provide updates regarding code status as they are available. At this time, patient remains Full Code.  MD/TOC(Ashley H.)/PMT provider aware of above updates.  ? ?V/S:  ?BP:   (!) 146/46 139/60 (!) 163/85  ?Pulse:   (!) 41 (!) 46 62  ?Resp:   14 18 17   ?Temp:   98 ?F (36.7 ?C) 98.2 ?F (36.8 ?C) 98 ?F (36.7 ?C)  ?TempSrc:          ?SpO2: 91% 96%   91% (3L [place])  ?I&O:  ?0/0 ?Labs:  ?Chloride 98 - 111 mmol/L 112 (H)  ?BUN 8 - 23 mg/dL 30 (H)  ?Creatinine 0.44 - 1.00 mg/dL 1.04 (H)  ?Calcium 8.9 - 10.3 mg/dL 7.9 (L)  ?GFR, Estimated >60 mL/min 51 (L)  ?RBC 3.87 - 5.11 MIL/uL 3.31 (L)  ?Hemoglobin 12.0 - 15.0 g/dL 9.7 (L)  ?HCT 36.0 - 46.0 % 30.7 (L)  ?RDW 11.5 - 15.5 % 15.7 (H)  ?Platelets 150 -  400 K/uL 148 (L)  ?(H): Data is abnormally high ?(L): Data is abnormally low ?Diagnostics:  ?No new diagnostics to report. ?PRN: ?LR 1L bolus IV x 1, NS 1L bolus IV x 1, haldol 2 mg IV x 1 for agitation, zithromax 500 mg IV x 1, rocephin 2 g IV x 1 ?  ?Patient is inpatient appropriate due to need for IVF and IV abx for sepsis.  ?  ?Problem List: ?- sepsis - source unclear, LA 4.1, hypotensive on arrival, rocephin, zithromax and IVF  ?- hypoxia - CT and CXR unremarkable, SPO2 100% on O2 ?- Lewy Body dementia - under hospice services for this ?GOC:  ?Please refer to updates above. [place] conversation continues.  ?Family:  ?Refer to above updates.  ?D/C planning:  ?ongoing ?IDT:  ?Hospice team updated ?  ?Thank you, ?Daphene Calamity, MSW ?[facility] Hospital Liaison  ?

## 2022-01-02 NOTE — Hospital Course (Signed)
Shirley Villanueva is a 88 y.o. 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 ?

## 2022-01-02 NOTE — Progress Notes (Signed)
Initial Nutrition Assessment ? ?DOCUMENTATION CODES:  ? ?Severe malnutrition in context of chronic illness ? ?INTERVENTION:  ? ?-Liberalize diet to regular to provide widest variety of meal selections in attempt to maximize oral intake ?-MVI with minerals daily ?-Continue Ensure Enlive po BID, each supplement provides 350 kcal and 20 grams of protein.  ? ?NUTRITION DIAGNOSIS:  ? ?Severe Malnutrition related to chronic illness as evidenced by moderate fat depletion, severe fat depletion, moderate muscle depletion, severe muscle depletion. ? ?GOAL:  ? ?Patient will meet greater than or equal to 90% of their needs ? ?MONITOR:  ? ?PO intake, Supplement acceptance, Labs, Weight trends, Skin, I & O's ? ?REASON FOR ASSESSMENT:  ? ?Consult ?Assessment of nutrition requirement/status ? ?ASSESSMENT:  ? ?Shirley Villanueva is a 88 y.o. 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 ? ?Pt admitted with acute hypoxemic respiratory failure, pulmonary hypertension, and COPD exacerbation.  ? ?Reviewed I/O's: +2 L x 24 hours ? ?Pt very lethargic at time of visit. She is unable to provide any history.  ? ?Per sitter at bedside, pt consumed about 50% of breakfast and an Ensure today.  ?  ?Reviewed wt hx; wt has been stable over the past 4 months.  ? ?Palliative care consult pending for GOC.  ? ?Medications reviewed and include melatonin, prednisone, and senokot.  ? ?Labs reviewed: CBGS: 114.   ? ?NUTRITION - FOCUSED PHYSICAL EXAM: ? ?Flowsheet Row Most Recent Value  ?Orbital Region Moderate depletion  ?Upper Arm Region Severe depletion  ?Thoracic and Lumbar Region Severe depletion  ?Buccal Region Moderate depletion  ?Temple Region Moderate depletion  ?Clavicle Bone Region Severe depletion  ?Clavicle and  Acromion Bone Region Severe depletion  ?Scapular Bone Region Severe depletion  ?Dorsal Hand Severe depletion  ?Patellar Region Severe depletion  ?Anterior Thigh Region Severe depletion  ?Posterior Calf Region Severe depletion  ?Edema (RD Assessment) None  ?Hair Reviewed  ?Eyes Reviewed  ?Mouth Reviewed  ?Skin Reviewed  ?Nails Reviewed  ? ?  ? ? ?Diet Order:   ?Diet Order   ? ?       ?  Diet Heart Room service appropriate? Yes; Fluid consistency: Thin  Diet effective now       ?  ? ?  ?  ? ?  ? ? ?EDUCATION NEEDS:  ? ?Not appropriate for education at this time ? ?Skin:  Skin Assessment: Reviewed RN Assessment ? ?Last BM:  Unknown ? ?Height:  ? ?Ht Readings from Last 1 Encounters:  ?01/01/22 5' (1.524 m)  ? ? ?Weight:  ? ?Wt Readings from Last 1 Encounters:  ?01/01/22 59.4 kg  ? ? ?Ideal Body Weight:  45.5 kg ? ?BMI:  Body mass index is 25.58 kg/m?. ? ?Estimated Nutritional Needs:  ? ?Kcal:  1600-1800 ? ?Protein:  75-90 grams ? ?Fluid:  > 1.6 L ? ? ? ?Levada Schilling, RD, LDN, CDCES ?Registered Dietitian II ?Certified Diabetes Care and Education Specialist ?Please refer to [facility] for RD and/or RD on-call/weekend/after hours pager  ?

## 2022-01-03 DIAGNOSIS — J441 Chronic obstructive pulmonary disease with (acute) exacerbation: Secondary | ICD-10-CM

## 2022-01-03 MED ORDER — ALPRAZOLAM 0.25 MG PO TABS
0.2500 mg | ORAL_TABLET | Freq: Two times a day (BID) | ORAL | 0 refills | Status: AC | PRN
Start: 2022-01-03 — End: ?

## 2022-01-03 MED ORDER — PREDNISONE 20 MG PO TABS
20.0000 mg | ORAL_TABLET | Freq: Every day | ORAL | 0 refills | Status: AC
Start: 2022-01-04 — End: 2022-01-07

## 2022-01-03 NOTE — Progress Notes (Signed)
Mobility Specialist - Progress Note ? ? 01/03/22 1659  ?Mobility  ?Activity Ambulated with assistance in hallway  ?Level of Assistance Moderate assist, patient does 50-74%  ?Musician;Other (Comment) ?(2 HHA)  ?Distance Ambulated (ft) 100 ft  ?Activity Response Tolerated well  ?$Mobility charge 1 Mobility  ? ? ? ?Pt lying in bed upon arrival, utilizing RA. Pt sat EOB with modA---difficulty following commands throughout session. Pt began ambulation with 2 HHA and continued out remainder of bout with RW. Assist for hand placement onto RW and navigation. Heavy tactile cueing throughout session d/t limited cognitive ability. Poor safety awareness. MaxA to complete turns with heavy tactile guidance to maintain alignment between LE and waist. Intoeing noted on LLE. Veers R. Pt returned to bed with needs in reach, sitter present at exit.  ? ? ?Shirley Villanueva ?Mobility Specialist ?01/03/22, 5:07 PM ?  ? ? ? ?

## 2022-01-03 NOTE — Discharge Summary (Signed)
?Physician Discharge Summary ?  ?Patient: Shirley Villanueva MRN: [id number] DOB: October 02, 1927  ?Admit date:     01/01/2022  ?Discharge date: 01/03/22  ?Discharge Physician: Sharen Hones  ? ?PCP: Haynes Hoehn, NP  ? ?Recommendations at discharge:  ? ?Follow-up provider in nursing home, referred to hospice. ? ?Discharge Diagnoses: ?Principal Problem: ?  Acute respiratory failure with hypoxia ([place]) ?Active Problems: ?  Lewy body dementia ([place]) ?  Acute metabolic encephalopathy ?  Sinus bradycardia ?  COPD (chronic obstructive pulmonary disease) ([place]) ?  HLD (hyperlipidemia) ?  TIA (transient ischemic attack) ?  Depression with anxiety ?  AV block, Mobitz 2 ?  Protein-calorie malnutrition, severe ([place]) ?  Elevated lactic acid level ?  Right rib fracture ?  Chronic kidney disease, stage 3a ([place]) ?  Chronic diastolic CHF (congestive heart failure) ([place]) ? ?Resolved Problems: ?  * No resolved hospital problems. * ? ?Hospital Course: ?Shirley Villanueva is a 88 y.o. 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 ? ?Assessment and Plan: ?Acute hypoxemic respiratory failure  ?Pulmonary hypertension moderate in severity ?Severe tricuspid regurgitation. ?Moderate mitral valve regurgitation. ?COPD exacerbation. POA ?Echocardiogram was performed, showed normal ejection fraction, severe tricuspid regurgitation, moderate mitral valve regurgitation and moderate pulm hypertension.  She also had COP exacerbation.  She was given treatment with oxygen, she was also given steroids. ?Her condition has improved, she is off oxygen now.  She is medically stable to be back to nursing home.  We will continue 3 days of oral prednisone. ?Patient long-term prognosis is very poor, continue discussion with I adult protection  agency for CODE STATUS, potential transfer to hospice care ?  ?Lewy body dementia. ?Acute metabolic cephalopathy. ?Patient still confused with occasional agitation. ?  ?Sinus bradycardia with Mobitz type II AV block. ?Chronic diastolic congestive heart failure. ?No intervention due to poor prognosis. ? ?Severe protein calorie malnutrition. ?Patient need to be fed. ? ?Lactic acidosis. ?Patient does not have acute infection, procalcitonin level less than 0.1, condition improved after giving fluids. ? ?Right rib fracture pain ?As needed pain medicine ? ?Chronic kidney disease stage IIIa. ?Renal function stable. ? ? ?  ? ? ?Consultants: None ?Procedures performed: None  ?Disposition: Skilled nursing facility ?Diet recommendation:  ?Discharge Diet Orders (From admission, onward)  ? ?  Start     Ordered  ? 01/03/22 0000  Diet - low sodium heart healthy       ? 01/03/22 1025  ? ?  ?  ? ?  ? ?Cardiac diet ?DISCHARGE MEDICATION: ?Allergies as of 01/03/2022   ?No Known Allergies ?  ? ?  ?Medication List  ?  ? ?STOP taking these medications   ? ?calcium carbonate 1250 (500 Ca) MG tablet ?Commonly known as: OS-CAL - dosed in mg of elemental calcium ?  ? ?  ? ?TAKE these medications   ? ?ALPRAZolam 0.25 MG tablet ?Commonly known as: Duanne Moron ?Take 1 tablet (0.25 mg total) by mouth 2 (two) times daily as needed. ?  ?atorvastatin 20 MG tablet ?Commonly known as: LIPITOR ?Take 20 mg by mouth daily. ?  ?divalproex 125 MG capsule ?Commonly known as: DEPAKOTE SPRINKLE ?Take 250 mg by mouth 2 (two) times daily. ?  ?melatonin 5 MG Tabs ?Take 10 mg by mouth at bedtime. ?  ?omeprazole 20  MG capsule ?Commonly known as: PRILOSEC ?Take 20 mg by mouth daily. ?  ?PARoxetine 30 MG tablet ?Commonly known as: PAXIL ?Take 30 mg by mouth daily. ?  ?predniSONE 20 MG tablet ?Commonly known as: DELTASONE ?Take 1 tablet (20 mg total) by mouth daily with breakfast for 3 days. ?Start taking on: January 04, 2022 ?  ?senna-docusate 8.6-50 MG tablet ?Commonly  known as: Senokot-S ?Take 2 tablets by mouth daily. ?  ? ?  ? ? Follow-up Information   ? ? Haynes Hoehn, NP Follow up in 1 week(s).   ?Contact information: ?[address] ?[place] [address] ?[phone] ? ? ?  ?  ? ?  ?  ? ?  ? ?Discharge Exam: ?Filed Weights  ? 01/01/22 0929  ?Weight: 59.4 kg  ? ?General exam: Appears calm and comfortable, severely malnourished. ?Respiratory system: Clear to auscultation. Respiratory effort normal. ?Cardiovascular system: S1 & S2 heard, RRR. No JVD, murmurs, rubs, gallops or clicks. No pedal edema. ?Gastrointestinal system: Abdomen is nondistended, soft and nontender. No organomegaly or masses felt. Normal bowel sounds heard. ?Central nervous system: Alert and oriented x1. No focal neurological deficits. ?Extremities: Symmetric 5 x 5 power. ?Skin: No rashes, lesions or ulcers ? ? ? ?Condition at discharge: fair ? ?The results of significant diagnostics from this hospitalization (including imaging, microbiology, ancillary and laboratory) are listed below for reference.  ? ?Imaging Studies: ?CT HEAD WO CONTRAST (5MM) ? ?Result Date: 01/02/2022 ?CLINICAL DATA:  Mental status change, unknown cause. EXAM: CT HEAD WITHOUT CONTRAST TECHNIQUE: Contiguous axial images were obtained from the base of the skull through the vertex without intravenous contrast. RADIATION DOSE REDUCTION: This exam was performed according to the departmental dose-optimization program which includes automated exposure control, adjustment of the mA and/or kV according to patient size and/or use of iterative reconstruction technique. COMPARISON:  Head CT 12/12/2021 FINDINGS: Brain: There is moderately advanced cerebral atrophy, small vessel disease and atrophic ventriculomegaly, with again noted disproportionate mesial temporal cortical volume loss on both sides and relatively mild cerebellar atrophy. No new asymmetry is seen concerning for an acute infarct, hemorrhage or mass. There are scattered dural  calcifications along the falx. There is no midline shift. Vascular: There are calcifications in the carotid siphons, distal left vertebral artery. There are no hyperdense central vessels. Skull: The calvarium, skull base and orbits are intact. Osteopenia. Sinuses/Orbits: No acute finding. Other: None. IMPRESSION: No acute intracranial CT findings or interval changes. Electronically Signed   By: Telford Nab M.D.   On: 01/02/2022 03:57  ? ?CT HEAD WO CONTRAST (5MM) ? ?Result Date: 12/12/2021 ?CLINICAL DATA:  Altered mental status EXAM: CT HEAD WITHOUT CONTRAST TECHNIQUE: Contiguous axial images were obtained from the base of the skull through the vertex without intravenous contrast. RADIATION DOSE REDUCTION: This exam was performed according to the departmental dose-optimization program which includes automated exposure control, adjustment of the mA and/or kV according to patient size and/or use of iterative reconstruction technique. COMPARISON:  12/31/2020 FINDINGS: Brain: No acute intracranial findings are seen. There is dilation of third and both lateral ventricles. There is no shift of midline structures. Cortical sulci are prominent. Minimal calcifications are seen in the basal ganglia on the left side. Vascular: Scattered arterial calcifications are seen. Skull: No fracture is seen in the calvarium. Sinuses/Orbits: There is mild mucosal thickening in the ethmoid sinus. Other: None IMPRESSION: No acute intracranial findings are seen in noncontrast CT brain. Central and cortical atrophy. Electronically Signed   By: Royston Cowper  Rathinasamy M.D.   On: 12/12/2021 15:42  ? ?CT CHEST WO CONTRAST ? ?Result Date: 01/01/2022 ?CLINICAL DATA:  Respiratory illness. Nondiagnostic x-ray. Hypoxia. Dementia and agitation. EXAM: CT CHEST WITHOUT CONTRAST TECHNIQUE: Multidetector CT imaging of the chest was performed following the standard protocol without IV contrast. RADIATION DOSE REDUCTION: This exam was performed according to the  departmental dose-optimization program which includes automated exposure control, adjustment of the mA and/or kV according to patient size and/or use of iterative reconstruction technique. COMPARISON:  Chest

## 2022-01-03 NOTE — Assessment & Plan Note (Signed)
Patient has history of TIA, no current CVA. ?

## 2022-01-03 NOTE — TOC Progression Note (Signed)
Transition of Care (TOC) - Progression Note  ? ? ?Patient Details  ?Name: Shirley Villanueva ?MRN: [id number] ?Date of Birth: [birth year] ? ?Transition of Care (TOC) CM/SW Contact  ?Trenden Hazelrigg A Mekiyah Gladwell, LCSW ?Phone Number: ?01/03/2022, 11:38 AM ? ?Clinical Narrative:   CSW spoke with Shanda Bumps, pt's legal guardian and she is aware pt is dc to [facility] with hospice today. Shanda Bumps states they never received NP rec for DNR--[name] notified and will follow up.  ? ?CSW spoke with Tammy at [facility] and she is prepared to take pt back today with Hospice--[facility] aware as well.  ? ?Per Tammy they will not have transport today. EMS Will be arranged. DC summary sent via fax. ? ? ? ?Expected Discharge Plan: Home w Hospice Care ?Barriers to Discharge: Continued Medical Work up ? ?Expected Discharge Plan and Services ?Expected Discharge Plan: Home w Hospice Care ?  ?  ?  ?Living arrangements for the past 2 months:  ([name]) ?Expected Discharge Date: 01/03/22               ?  ?  ?  ?  ?  ?  ?  ?  ?  ?  ? ? ?Social Determinants of Health (SDOH) Interventions ?  ? ?Readmission Risk Interventions ?   ? View : No data to display.  ?  ?  ?  ? ? ?

## 2022-01-03 NOTE — TOC Transition Note (Signed)
Transition of Care (TOC) - CM/SW Discharge Note ? ? ?Patient Details  ?Name: DEANNIE RESETAR ?MRN: [id number] ?Date of Birth: 05-Jun-1928 ? ?Transition of Care (TOC) CM/SW Contact:  ?Katrianna Friesenhahn A Aiyonna Lucado, LCSW ?Phone Number: ?01/03/2022, 12:04 PM ? ? ?Clinical Narrative:   Clinical Social Worker facilitated patient discharge including contacting patient family and facility to confirm patient discharge plans.  Clinical information faxed to facility and family agreeable with plan.  CSW arranged ambulance transport via [facility] to [name] .  RN to call [phone] for report prior to discharge. ? ? ? ? ?Final next level of care: Assisted Living ?Barriers to Discharge: No Barriers Identified ? ? ?Patient Goals and CMS Choice ?Patient states their goals for this hospitalization and ongoing recovery are:: to go home ?CMS Medicare.gov Compare Post Acute Care list provided to:: Patient Represenative (must comment) (APS) ?Choice offered to / list presented to : [facility] POA / Guardian ? ?Discharge Placement ?  ?           ?Patient chooses bed at:  ([name]) ?Patient to be transferred to facility by: [facility] ?Name of family member notified: Legal Guardian ?Patient and family notified of of transfer: 01/03/22 ? ?Discharge Plan and Services ?  ?  ?           ?  ?  ?  ?  ?  ?  ?  ?  ?  ?  ? ?Social Determinants of Health (SDOH) Interventions ?  ? ? ?Readmission Risk Interventions ?   ? View : No data to display.  ?  ?  ?  ? ? ? ? ? ?

## 2022-01-06 LAB — CULTURE, BLOOD (ROUTINE X 2)
Culture: NO GROWTH
Culture: NO GROWTH

## 2022-02-22 ENCOUNTER — Encounter: Payer: Self-pay | Admitting: *Deleted

## 2022-02-22 ENCOUNTER — Emergency Department
Admission: EM | Admit: 2022-02-22 | Discharge: 2022-02-23 | Disposition: A | Discharge status: Home / Self Care | Attending: Emergency Medicine | Admitting: Emergency Medicine

## 2022-02-22 ENCOUNTER — Emergency Department

## 2022-02-22 ENCOUNTER — Other Ambulatory Visit: Payer: Self-pay

## 2022-02-22 DIAGNOSIS — F0390 Unspecified dementia without behavioral disturbance: Secondary | ICD-10-CM | POA: Diagnosis not present

## 2022-02-22 DIAGNOSIS — W1830XA Fall on same level, unspecified, initial encounter: Secondary | ICD-10-CM | POA: Insufficient documentation

## 2022-02-22 DIAGNOSIS — S0990XA Unspecified injury of head, initial encounter: Secondary | ICD-10-CM | POA: Diagnosis present

## 2022-02-22 DIAGNOSIS — S060X0A Concussion without loss of consciousness, initial encounter: Secondary | ICD-10-CM | POA: Diagnosis not present

## 2022-02-22 DIAGNOSIS — S0181XA Laceration without foreign body of other part of head, initial encounter: Secondary | ICD-10-CM | POA: Insufficient documentation

## 2022-02-22 MED ORDER — LIDOCAINE-EPINEPHRINE (PF) 2 %-1:200000 IJ SOLN
INTRAMUSCULAR | Status: AC
  Filled 2022-02-22: qty 20

## 2022-02-22 MED ORDER — LIDOCAINE-EPINEPHRINE 2 %-1:100000 IJ SOLN
20.0000 mL | Freq: Once | INTRAMUSCULAR | Status: AC
Start: 2022-02-22 — End: 2022-02-22
  Administered 2022-02-22: 20 mL via INTRADERMAL

## 2022-02-22 NOTE — ED Notes (Signed)
ED providers at bedside.

## 2022-02-22 NOTE — ED Notes (Signed)
Pt cleaned of stool and urine at this time.  Clean brief placed on pt and external female catheter applied.  NAD noted.  Pt in ED room 2 at this time.  EDT John sitting in room with pt for safety,   

## 2022-02-22 NOTE — TOC Initial Note (Signed)
Transition of Care [facility]) - Initial/Assessment Note    Patient Details  Name: Shirley Villanueva MRN: [id number] Date of Birth: [birth year]  Transition of [facility]) CM/SW Contact:    Shelbie Hutching, RN Phone Number: 02/22/2022, 6:26 PM  Clinical Narrative:                 Patient brought in to the emergency room after falling at [facility].  RNCM received a call from Earl Lagos, [place] [name] SW.  Janett Billow requests that patient not be sent back to [facility], she has secured a bed at [facility] can accept patient tomorrow.  Emergency room MD notified and will keep patient overnight.    Expected Discharge Plan: Skilled Nursing Facility Barriers to Discharge: Continued Medical Work up   Patient Goals and CMS Choice Patient states their goals for this hospitalization and ongoing recovery are:: Earl Lagos with [place] [name] has found placement at [facility]      Expected Discharge Plan and Services Expected Discharge Plan: [place]   Discharge Planning Services: CM Consult   Living arrangements for the past 2 months: Assisted Living Facility                 DME Arranged: N/A DME Agency: NA       HH Arranged: NA          Prior Living Arrangements/Services Living arrangements for the past 2 months: [place] Lives with:: Facility Resident Patient language and need for interpreter reviewed:: Yes Do you feel safe going back to the place where you live?: No   [name] can no longer meet patient's needs  Need for Family Participation in Patient Care: Yes (Comment) Care giver support system in place?: Yes (comment) ([place] [name])   Criminal Activity/Legal Involvement Pertinent to Current Situation/Hospitalization: No - Comment as needed  Activities of Daily Living      Permission Sought/Granted Permission sought to share information with : Case Manager, Other (comment), Facility Sport and exercise psychologist Permission granted to share  information with : Yes, Verbal Permission Granted  Share Information with NAME: Earl Lagos  Permission granted to share info w AGENCY: [facility]  Permission granted to share info w Relationship: [place] [name] SW  Permission granted to share info w Contact Information: [phone]  Emotional Assessment         Alcohol / Substance Use: Not Applicable Psych Involvement: No (comment)  Admission diagnosis:  fall Patient Active Problem List   Diagnosis Date Noted   Acute respiratory failure with hypoxia ([place]) 01/01/2022   Elevated lactic acid level 01/01/2022   Right rib fracture 01/01/2022   Chronic kidney disease, stage 3a ([place]) 01/01/2022   Chronic diastolic CHF (congestive heart failure) ([place]) 01/01/2022   Protein-calorie malnutrition, severe ([place]) 12/16/2021   AV block, Mobitz 2 12/15/2021   Failure to thrive in adult 12/14/2021   Sinus bradycardia 12/13/2021   Thrush 12/13/2021   COVID-19 virus infection 12/12/2021   Hypernatremia 12/12/2021   Hyperkalemia 12/12/2021   Acute renal failure superimposed on stage 3a chronic kidney disease ([place]) 0000000   Acute metabolic encephalopathy 0000000   TIA (transient ischemic attack) 12/31/2020   CKD (chronic kidney disease), stage IIIa 12/31/2020   Depression with anxiety 12/31/2020   HLD (hyperlipidemia) 08/31/2019   Sepsis ([place]) 08/31/2019   E. coli UTI 08/31/2019   Lewy body dementia ([place])    COPD (chronic obstructive pulmonary disease) ([place])    Hypothermia    Cold exposure    Elevated  troponin    Leucocytosis    PCP:  Haynes Hoehn, NP Pharmacy:   [facility], [place] - [address] [place] [address] Phone: [phone] Fax: [phone]     Social Determinants of Health (SDOH) Interventions    Readmission Risk Interventions     View : No data to display.

## 2022-02-22 NOTE — ED Provider Notes (Signed)
[facility] Provider Note   Event Date/Time   First MD Initiated Contact with Patient 02/22/22 1735     (approximate) History  Fall, Head Injury, and Laceration  HPI Shirley Villanueva is a 88 y.o. female with a past medical history of of dementia and not alert or oriented at baseline who presents for a unwitnessed fall with a laceration to the right upper scalp.  Unknown loss of consciousness.  Patient is unable to participate in history or review of systems due to mental status.   Physical Exam  Triage Vital Signs: ED Triage Vitals  Enc Vitals Group     BP 02/22/22 1639 (!) 182/86     Pulse Rate 02/22/22 1639 69     Resp 02/22/22 1639 20     Temp 02/22/22 1639 99.6 F (37.6 C)     Temp Source 02/22/22 1639 Axillary     SpO2 02/22/22 1639 98 %     Weight 02/22/22 1637 130 lb (59 kg)     Height 02/22/22 1637 5' (1.524 m)     Head Circumference --      Peak Flow --      Pain Score 02/22/22 1637 5     Pain Loc --      Pain Edu? --      Excl. in GC? --    Most recent vital signs: Vitals:   02/22/22 1639 02/22/22 1957  BP: (!) 182/86 (!) 138/92  Pulse: 69 84  Resp: 20 17  Temp: 99.6 F (37.6 C) 99.2 F (37.3 C)  SpO2: 98% 97%   General: Awake, cooperative CV:  Good peripheral perfusion.  Resp:  Normal effort.  Abd:  No distention.  Other:  Elderly cachectic Caucasian female laying in bed in no acute distress with a 2 cm vertical linear laceration to the right upper forehead ED Results / Procedures / Treatments  RADIOLOGY ED MD interpretation: CT of the head without contrast interpreted by me shows no evidence of acute abnormalities including no intracerebral hemorrhage, obvious masses, or significant edema.  There is evidence of laceration to the right upper forehead  CT of the cervical spine interpreted by me does not show any evidence of acute abnormalities including no acute fracture, malalignment, height loss, or dislocation -Agree with  radiology assessment Official radiology report(s): CT Head Wo Contrast  Result Date: 02/22/2022 CLINICAL DATA:  Fall with right forehead laceration and right orbital bruising. Advanced dementia. EXAM: CT HEAD WITHOUT CONTRAST CT CERVICAL SPINE WITHOUT CONTRAST TECHNIQUE: Multidetector CT imaging of the head and cervical spine was performed following the standard protocol without intravenous contrast. Multiplanar CT image reconstructions of the cervical spine were also generated. RADIATION DOSE REDUCTION: This exam was performed according to the departmental dose-optimization program which includes automated exposure control, adjustment of the mA and/or kV according to patient size and/or use of iterative reconstruction technique. COMPARISON:  Prior CT of the head on 01/02/2022 FINDINGS: CT HEAD FINDINGS Brain: Stable severe cortical atrophy and periventricular small vessel disease. No evidence of acute infarction, hemorrhage, hydrocephalus, extra-axial collection or mass lesion/mass effect. Vascular: No hyperdense vessel or unexpected calcification. Skull: Normal. Negative for fracture or focal lesion. Sinuses/Orbits: No sinus disease. Right periorbital hemorrhage/soft tissue swelling. Other: Right-sided frontal scalp hemorrhage with more focal soft tissue laceration present. No underlying soft tissue foreign body. Hemorrhage extends into the right lateral and superior periorbital region. CT CERVICAL SPINE FINDINGS Evaluation of the cervical spine by CT was severely limited due  to significant patient motion despite several attempts at scanning. No gross fracture or subluxation identified. Moderately advanced degenerative disc disease present at C3-4, C4-5, C5-6 and C6-7. No bony lesions or prevertebral soft tissue swelling. No abnormalities at the visualized lung apices. IMPRESSION: 1. Right-sided frontal scalp hemorrhage and scalp laceration with hemorrhage also extending to the right periorbital region. No  evidence of underlying skull fracture or soft tissue foreign body. 2. Stable severe cortical atrophy and periventricular small vessel disease. 3. Severely limited evaluation of the cervical spine due to persistent motion. No gross fracture or subluxation identified. Degenerative disc disease present at multiple levels. Electronically Signed   By: Irish Lack M.D.   On: 02/22/2022 19:11   CT Cervical Spine Wo Contrast  Result Date: 02/22/2022 CLINICAL DATA:  Fall with right forehead laceration and right orbital bruising. Advanced dementia. EXAM: CT HEAD WITHOUT CONTRAST CT CERVICAL SPINE WITHOUT CONTRAST TECHNIQUE: Multidetector CT imaging of the head and cervical spine was performed following the standard protocol without intravenous contrast. Multiplanar CT image reconstructions of the cervical spine were also generated. RADIATION DOSE REDUCTION: This exam was performed according to the departmental dose-optimization program which includes automated exposure control, adjustment of the mA and/or kV according to patient size and/or use of iterative reconstruction technique. COMPARISON:  Prior CT of the head on 01/02/2022 FINDINGS: CT HEAD FINDINGS Brain: Stable severe cortical atrophy and periventricular small vessel disease. No evidence of acute infarction, hemorrhage, hydrocephalus, extra-axial collection or mass lesion/mass effect. Vascular: No hyperdense vessel or unexpected calcification. Skull: Normal. Negative for fracture or focal lesion. Sinuses/Orbits: No sinus disease. Right periorbital hemorrhage/soft tissue swelling. Other: Right-sided frontal scalp hemorrhage with more focal soft tissue laceration present. No underlying soft tissue foreign body. Hemorrhage extends into the right lateral and superior periorbital region. CT CERVICAL SPINE FINDINGS Evaluation of the cervical spine by CT was severely limited due to significant patient motion despite several attempts at scanning. No gross fracture or  subluxation identified. Moderately advanced degenerative disc disease present at C3-4, C4-5, C5-6 and C6-7. No bony lesions or prevertebral soft tissue swelling. No abnormalities at the visualized lung apices. IMPRESSION: 1. Right-sided frontal scalp hemorrhage and scalp laceration with hemorrhage also extending to the right periorbital region. No evidence of underlying skull fracture or soft tissue foreign body. 2. Stable severe cortical atrophy and periventricular small vessel disease. 3. Severely limited evaluation of the cervical spine due to persistent motion. No gross fracture or subluxation identified. Degenerative disc disease present at multiple levels. Electronically Signed   By: Irish Lack M.D.   On: 02/22/2022 19:11   PROCEDURES: Critical Care performed: No ..Laceration Repair  Date/Time: 02/22/2022 9:01 PM Performed by: Merwyn Katos, MD Authorized by: Merwyn Katos, MD   Consent:    Consent obtained:  Verbal and emergent situation   Consent given by:  Guardian   Risks, benefits, and alternatives were discussed: yes     Risks discussed:  Infection, pain and poor wound healing   Alternatives discussed:  No treatment and delayed treatment Universal protocol:    Immediately prior to procedure, a time out was called: yes     Patient identity confirmed:  Hospital-assigned identification number Anesthesia:    Anesthesia method:  Local infiltration   Local anesthetic:  Lidocaine 2% WITH epi Laceration details:    Location:  Face   Face location:  Forehead   Length (cm):  2   Depth (mm):  5 Pre-procedure details:    Preparation:  Patient  was prepped and draped in usual sterile fashion and imaging obtained to evaluate for foreign bodies Treatment:    Area cleansed with:  Povidone-iodine and saline   Amount of cleaning:  Standard   Irrigation solution:  Sterile saline   Irrigation volume:  100   Irrigation method:  Pressure wash   Visualized foreign bodies/material  removed: no     Debridement:  None   Undermining:  None   Scar revision: no   Skin repair:    Repair method:  Sutures   Suture size:  4-0   Suture material:  Chromic gut   Suture technique:  Simple interrupted   Number of sutures:  4 Approximation:    Approximation:  Close Repair type:    Repair type:  Simple Post-procedure details:    Dressing:  Antibiotic ointment and non-adherent dressing   Procedure completion:  Tolerated well, no immediate complications MEDICATIONS ORDERED IN ED: Medications  lidocaine-EPINEPHrine (XYLOCAINE W/EPI) 2 %-1:200000 (PF) injection (  Canceled Entry 02/22/22 1925)  lidocaine-EPINEPHrine (XYLOCAINE W/EPI) 2 %-1:100000 (with pres) injection 20 mL (20 mLs Intradermal Given by Other 02/22/22 1924)   IMPRESSION / MDM / ASSESSMENT AND PLAN / ED COURSE  I reviewed the triage vital signs and the nursing notes.                             The patient is on the cardiac monitor to evaluate for evidence of arrhythmia and/or significant heart rate changes. Patient's presentation is most consistent with acute presentation with potential threat to life or bodily function. Patient presenting with head trauma.  Patient's neurological exam was non-focal and unremarkable.  [place] Head CT Rule was applied and patient did not fall into the low risk category so a head CT was obtained.  This showed no significant findings.  At this time, it is felt that the most likely explanation for the patient's symptoms is concussion.   I also considered SAH, SDH, Epidural Hematoma, IPH, skull fracture, migraine but this appears less likely considering the data gathered thus far.   Patient provided laceration repair.   Patient remained stable and neurologically intact while in the emergency department.  Discussed warning signs that would prompt return to ED.  Head trauma handout was provided.  Discussed in detail concussion management.  No sports or strenuous activity until symptoms free.   Return to emergency department urgently if new or worsening symptoms develop.    Impression:  Concussion Forehead laceration  Plan  [place] for please speak using different skilled nursing facility tomorrow I was contacted by our local social workers who was informed by patient's [name] caseworker that patient is no longer to go back to her current long-term care facility and will need to be held in the emergency department until she can go to her new facility in the morning   FINAL CLINICAL IMPRESSION(S) / ED DIAGNOSES   Final diagnoses:  Injury of head, initial encounter  Laceration of forehead, initial encounter   Rx / DC Orders   ED Discharge Orders     None      Note:  This document was prepared using Dragon voice recognition software and may include unintentional dictation errors.   Merwyn KatosBradler, Bryton Waight K, MD 02/22/22 2103

## 2022-02-22 NOTE — ED Notes (Signed)
Pt bed alarm on.

## 2022-02-22 NOTE — ED Triage Notes (Signed)
Pt from assisted living facility. Pt has advanced dementia and today fell. Pt has lac to rt forehead and bruising to right eye. Pt not on blood thinner. Confused at baseline.

## 2022-02-23 NOTE — ED Notes (Signed)
Pt resting at this time. Normal rise and fall of chest. Sitter at bedside.   Waiting for pt facility to open to arrange transport back when staff is available to speak to and give clarification of where this pt will be going to per night shift RN level of care changed.

## 2022-02-23 NOTE — TOC Transition Note (Signed)
Transition of Care [facility]) - CM/SW Discharge Note   Patient Details  Name: JANIEL DERHAMMER MRN: [id number] Date of Birth: 10/08/1927  Transition of Care [facility]) CM/SW Contact:  Allayne Butcher, RN Phone Number: 02/23/2022, 4:13 PM   Clinical Narrative:    Pasrr back [phone].  Ricky over at [employer] given new Pasrr number and he can accept patient today.  Patient will be going to room B15.  ED Secretary will call and set up [facility] transport.  ED RN will call report to [phone].  Theodis Shove with [place] [name] notified of discharge for today.     Final next level of care: Skilled Nursing Facility Barriers to Discharge: Barriers Resolved   Patient Goals and CMS Choice Patient states their goals for this hospitalization and ongoing recovery are:: Theodis Shove with [place] [name] has found placement at [facility].gov Compare Post Acute Care list provided to:: Patient Represenative (must comment) Choice offered to / list presented to : [facility] POA / Guardian  Discharge Placement PASRR number recieved: 02/23/22            Patient chooses bed at: [facility] [place] Patient to be transferred to facility by: [facility] Name of family member notified: Theodis Shove- [place] [name] Patient and family notified of of transfer: 02/23/22  Discharge Plan and Services   Discharge Planning Services: CM Consult            DME Arranged: N/A DME Agency: NA       HH Arranged: NA          Social Determinants of Health (SDOH) Interventions     Readmission Risk Interventions     View : No data to display.

## 2022-02-23 NOTE — ED Notes (Signed)
Report given to [place] at [employer].

## 2022-02-23 NOTE — TOC Progression Note (Signed)
Transition of Care [facility]) - Progression Note    Patient Details  Name: Shirley Villanueva MRN: [id number] Date of Birth: [birth year]  Transition of Care [facility]) CM/SW Contact  Allayne Butcher, RN Phone Number: 02/23/2022, 9:15 AM  Clinical Narrative:    RNCM spoke with Clide Cliff at [facility] this morning, he would like an FL2 and notes about ED visit.  Currently working on [employer]- waiting to hear from Theodis Shove, [place] [name] SW about the Pasrr number.     Expected Discharge Plan: Skilled Nursing Facility Barriers to Discharge: Continued Medical Work up  Expected Discharge Plan and Services Expected Discharge Plan: Skilled Nursing Facility   Discharge Planning Services: CM Consult   Living arrangements for the past 2 months: Assisted Living Facility                 DME Arranged: N/A DME Agency: NA       HH Arranged: NA           Social Determinants of Health (SDOH) Interventions    Readmission Risk Interventions     View : No data to display.

## 2022-02-23 NOTE — Progress Notes (Signed)
ED [facility])       This patient is a current hospice patient with [facility], admitted with a terminal diagnosis Lewy Body Dementia.   [facility] will continue to follow for any discharge planning needs and to coordinate continuation of hospice care. At this time, plan is for patient to transfer to [facility] facility per TOC/Jeanna.    Please call with any questions/concerns.    Thank you for the opportunity to participate in this patient's care.  Daphene Calamity, MSW [facility] Liaison  [phone]

## 2022-02-23 NOTE — NC FL2 (Signed)
[place] LEVEL OF CARE SCREENING TOOL     IDENTIFICATION  Patient Name: Shirley Villanueva Birthdate: 1928/09/15 Sex: female Admission Date (Current Location): 02/22/2022  [place] and [place] Number:  Engineering geologist and Address:  [facility], [address] [place], [place] [address]      Provider Number: [id number]  Attending Physician Name and Address:  No att. providers found  Relative Name and Phone Number:  Velia [place] [name] SW- [phone]    Current Level of Care: Other (Comment) (ED patient ready for discharge) Recommended Level of Care: [place] Prior Approval Number:    Date Approved/Denied:   PASRR Number: pending  Discharge Plan: SNF    Current Diagnoses: Patient Active Problem List   Diagnosis Date Noted   Acute respiratory failure with hypoxia ([place]) 01/01/2022   Elevated lactic acid level 01/01/2022   Right rib fracture 01/01/2022   Chronic kidney disease, stage 3a ([place]) 01/01/2022   Chronic diastolic CHF (congestive heart failure) ([place]) 01/01/2022   Protein-calorie malnutrition, severe ([place]) 12/16/2021   AV block, Mobitz 2 12/15/2021   Failure to thrive in adult 12/14/2021   Sinus bradycardia 12/13/2021   Thrush 12/13/2021   COVID-19 virus infection 12/12/2021   Hypernatremia 12/12/2021   Hyperkalemia 12/12/2021   Acute renal failure superimposed on stage 3a chronic kidney disease ([place]) 0000000   Acute metabolic encephalopathy 0000000   TIA (transient ischemic attack) 12/31/2020   CKD (chronic kidney disease), stage IIIa 12/31/2020   Depression with anxiety 12/31/2020   HLD (hyperlipidemia) 08/31/2019   Sepsis ([place]) 08/31/2019   E. coli UTI 08/31/2019   Lewy body dementia (HCC)    COPD (chronic obstructive pulmonary disease) (HCC)    Hypothermia    Cold exposure    Elevated troponin    Leucocytosis     Orientation RESPIRATION BLADDER Height & Weight     Self   Normal Incontinent Weight: 59 kg Height:  5' (152.4 cm)  BEHAVIORAL SYMPTOMS/MOOD NEUROLOGICAL BOWEL NUTRITION STATUS      Incontinent Diet (Regular)  AMBULATORY STATUS COMMUNICATION OF NEEDS Skin   Limited Assist Verbally Bruising, Other (Comment) (Laceration right upper forehead- 4 sutures  Bruising right eye)                       Personal Care Assistance Level of Assistance  Bathing, Feeding, Dressing Bathing Assistance: Maximum assistance Feeding assistance: Maximum assistance Dressing Assistance: Maximum assistance     Functional Limitations Info             SPECIAL CARE FACTORS FREQUENCY                       Contractures Contractures Info: Not present    Additional Factors Info  Code Status, Allergies Code Status Info: Full Allergies Info: NKA           Current Medications (02/23/2022):  This is the current hospital active medication list No current facility-administered medications for this encounter.   Current Outpatient Medications  Medication Sig Dispense Refill   ALPRAZolam (XANAX) 0.25 MG tablet Take 1 tablet (0.25 mg total) by mouth 2 (two) times daily as needed. 8 tablet 0   divalproex (DEPAKOTE SPRINKLE) 125 MG capsule Take 250 mg by mouth 2 (two) times daily.     omeprazole (PRILOSEC) 20 MG capsule Take 20 mg by mouth daily.     PARoxetine (PAXIL) 30 MG tablet Take 30 mg by  mouth daily.     atorvastatin (LIPITOR) 20 MG tablet Take 20 mg by mouth daily.     melatonin 5 MG TABS Take 10 mg by mouth at bedtime. (Patient not taking: Reported on 02/23/2022)     senna-docusate (SENOKOT-S) 8.6-50 MG tablet Take 2 tablets by mouth daily.       Discharge Medications: Please see discharge summary for a list of discharge medications.  Relevant Imaging Results:  Relevant Lab Results:   Additional Information SS #: [id number] ; [place]  Shelbie Hutching, RN
